# Patient Record
Sex: Male | Born: 2000 | Race: White | Hispanic: No | Marital: Single | Smoking: Current every day smoker
Health system: Southern US, Community
[De-identification: ages and names within clinical notes are randomized; demographics above are authoritative.]

---
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
## 2022-09-12 ENCOUNTER — Other Ambulatory Visit: Payer: Self-pay

## 2022-09-12 ENCOUNTER — Encounter (HOSPITAL_COMMUNITY): Payer: Self-pay

## 2022-09-12 ENCOUNTER — Emergency Department (HOSPITAL_COMMUNITY)
Admission: EM | Admit: 2022-09-12 | Discharge: 2022-09-13 | Disposition: A | Discharge status: Other Acute Inpatient Hospital | Payer: 59 | Source: Home / Self Care | Attending: Emergency Medicine | Admitting: Emergency Medicine

## 2022-09-12 DIAGNOSIS — Z046 Encounter for general psychiatric examination, requested by authority: Secondary | ICD-10-CM | POA: Insufficient documentation

## 2022-09-12 DIAGNOSIS — R45851 Suicidal ideations: Secondary | ICD-10-CM | POA: Insufficient documentation

## 2022-09-12 DIAGNOSIS — Z20822 Contact with and (suspected) exposure to covid-19: Secondary | ICD-10-CM | POA: Insufficient documentation

## 2022-09-12 DIAGNOSIS — F322 Major depressive disorder, single episode, severe without psychotic features: Secondary | ICD-10-CM | POA: Diagnosis not present

## 2022-09-12 DIAGNOSIS — F332 Major depressive disorder, recurrent severe without psychotic features: Secondary | ICD-10-CM | POA: Insufficient documentation

## 2022-09-12 MED ORDER — BUPROPION HCL ER (XL) 150 MG PO TB24
300.0000 mg | ORAL_TABLET | Freq: Every day | ORAL | Status: DC
  Administered 2022-09-13: 300 mg via ORAL
  Filled 2022-09-12: qty 2

## 2022-09-12 MED ORDER — SERTRALINE HCL 50 MG PO TABS
200.0000 mg | ORAL_TABLET | Freq: Every day | ORAL | Status: DC
  Administered 2022-09-13: 200 mg via ORAL
  Filled 2022-09-12: qty 4

## 2022-09-12 MED ORDER — BUSPIRONE HCL 10 MG PO TABS
30.0000 mg | ORAL_TABLET | Freq: Two times a day (BID) | ORAL | Status: DC
  Administered 2022-09-13 (×2): 30 mg via ORAL
  Filled 2022-09-12 (×3): qty 3

## 2022-09-12 NOTE — ED Provider Notes (Signed)
[facility] DEPT Provider Note   CSN: [id number] Arrival date & time: 09/12/22  2223     History  Chief Complaint  Patient presents with   IVC   Suicidal    Nim Gelwicks is a 21 y.o. male.  21 year old male brought in by police under IVC order by [facility].  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  Reports history of anxiety and depression.  Plan was to overdose on car monoxide or nitrous oxide if he could get a hold of these items.  Patient reports a prior suicide attempt by cutting his arm, history of suicidal ideation.  Reports feeling suicidal at this time without plan.  No other complaints or concerns tonight.       Home Medications Prior to Admission medications   Not on File      Allergies    Patient has no known allergies.    Review of Systems   Review of Systems Negative except as per HPI Physical Exam Updated Vital Signs BP (!) 131/90 (BP Location: Left Arm)   Pulse 100   Temp 98.4 F (36.9 C) (Oral)   Resp 18   Ht 6\' 2"  (1.88 m)   Wt 81.6 kg   SpO2 100%   BMI 23.11 kg/m  Physical Exam Vitals and nursing note reviewed.  Constitutional:      General: He is not in acute distress.    Appearance: He is well-developed. He is not diaphoretic.  HENT:     Head: Normocephalic and atraumatic.  Cardiovascular:     Rate and Rhythm: Normal rate and regular rhythm.     Pulses: Normal pulses.     Heart sounds: Normal heart sounds.  Pulmonary:     Effort: Pulmonary effort is normal.     Breath sounds: Normal breath sounds.  Skin:    General: Skin is warm and dry.     Findings: No erythema or rash.  Neurological:     Mental Status: He is alert and oriented to person, place, and time.  Psychiatric:        Attention and Perception: Attention normal.        Mood and Affect: Mood and affect normal.        Speech: Speech normal.        Behavior: Behavior  normal.        Thought Content: Thought content is not paranoid. Thought content includes suicidal ideation. Thought content does not include homicidal ideation. Thought content does not include homicidal or suicidal plan.     ED Results / Procedures / Treatments   Labs (all labs ordered are listed, but only abnormal results are displayed) Labs Reviewed  SARS CORONAVIRUS 2 BY RT PCR  ACETAMINOPHEN LEVEL  COMPREHENSIVE METABOLIC PANEL  ETHANOL  CBC WITH DIFFERENTIAL/PLATELET  SALICYLATE LEVEL  RAPID URINE DRUG SCREEN, HOSP PERFORMED    EKG None  Radiology No results found.  Procedures Procedures    Medications Ordered in ED Medications - No data to display  ED Course/ Medical Decision Making/ A&P                           Medical Decision Making  21 year old male brought in by [facility] under IVC from [place] as above.  No other complaints at this time.  Suicidal ideation without plan currently.  No other complaints.  Pending lab work,  will be medically cleared for behavioral health evaluation and disposition. First exam complete.  Home meds ordered.        Final Clinical Impression(s) / ED Diagnoses Final diagnoses:  Suicidal ideation    Rx / DC Orders ED Discharge Orders     None         Tacy Learn, PA-C 09/12/22 2342    Fredia Sorrow, MD 09/12/22 2352

## 2022-09-12 NOTE — ED Triage Notes (Addendum)
Pt BIB [facility] for IVC from [facility] [facility]. Per [facility] and paperwork--pt is an alcohol and amphetamine user. Pt c/o anxiety and depression. Pt reported tonight that he is having SI, plan to OD on carbon monoxide or nitrous oxide if he can get a hold of these items. Pt is a danger to himself. Pt appears calm and quiet at this time time in triage.

## 2022-09-12 NOTE — ED Notes (Signed)
Pt have been made aware of urine sample. Will info staff when able to give 

## 2022-09-13 ENCOUNTER — Encounter (HOSPITAL_COMMUNITY): Payer: Self-pay | Admitting: Psychiatry

## 2022-09-13 ENCOUNTER — Other Ambulatory Visit: Payer: Self-pay

## 2022-09-13 ENCOUNTER — Other Ambulatory Visit: Payer: Self-pay | Admitting: Psychiatry

## 2022-09-13 ENCOUNTER — Inpatient Hospital Stay (HOSPITAL_COMMUNITY)
Admission: AD | Admit: 2022-09-13 | Discharge: 2022-09-19 | DRG: 885 | Disposition: A | Discharge status: Home / Self Care | Payer: 59 | Source: Intra-hospital | Attending: Psychiatry | Admitting: Psychiatry

## 2022-09-13 DIAGNOSIS — F1010 Alcohol abuse, uncomplicated: Secondary | ICD-10-CM | POA: Diagnosis present

## 2022-09-13 DIAGNOSIS — F17210 Nicotine dependence, cigarettes, uncomplicated: Secondary | ICD-10-CM | POA: Diagnosis present

## 2022-09-13 DIAGNOSIS — F322 Major depressive disorder, single episode, severe without psychotic features: Secondary | ICD-10-CM | POA: Diagnosis present

## 2022-09-13 DIAGNOSIS — Z79899 Other long term (current) drug therapy: Secondary | ICD-10-CM

## 2022-09-13 DIAGNOSIS — R45851 Suicidal ideations: Secondary | ICD-10-CM | POA: Diagnosis present

## 2022-09-13 DIAGNOSIS — F1510 Other stimulant abuse, uncomplicated: Secondary | ICD-10-CM | POA: Diagnosis present

## 2022-09-13 DIAGNOSIS — F1090 Alcohol use, unspecified, uncomplicated: Secondary | ICD-10-CM | POA: Insufficient documentation

## 2022-09-13 DIAGNOSIS — F411 Generalized anxiety disorder: Secondary | ICD-10-CM | POA: Diagnosis present

## 2022-09-13 DIAGNOSIS — Z87828 Personal history of other (healed) physical injury and trauma: Secondary | ICD-10-CM | POA: Diagnosis not present

## 2022-09-13 DIAGNOSIS — Z20822 Contact with and (suspected) exposure to covid-19: Secondary | ICD-10-CM | POA: Diagnosis present

## 2022-09-13 DIAGNOSIS — F4010 Social phobia, unspecified: Secondary | ICD-10-CM | POA: Diagnosis present

## 2022-09-13 DIAGNOSIS — Z9151 Personal history of suicidal behavior: Secondary | ICD-10-CM | POA: Diagnosis not present

## 2022-09-13 DIAGNOSIS — F909 Attention-deficit hyperactivity disorder, unspecified type: Secondary | ICD-10-CM | POA: Diagnosis present

## 2022-09-13 DIAGNOSIS — F1220 Cannabis dependence, uncomplicated: Secondary | ICD-10-CM | POA: Diagnosis present

## 2022-09-13 DIAGNOSIS — G4700 Insomnia, unspecified: Secondary | ICD-10-CM | POA: Diagnosis present

## 2022-09-13 DIAGNOSIS — Z818 Family history of other mental and behavioral disorders: Secondary | ICD-10-CM | POA: Diagnosis not present

## 2022-09-13 DIAGNOSIS — Z751 Person awaiting admission to adequate facility elsewhere: Secondary | ICD-10-CM

## 2022-09-13 DIAGNOSIS — F1590 Other stimulant use, unspecified, uncomplicated: Secondary | ICD-10-CM | POA: Insufficient documentation

## 2022-09-13 LAB — ACETAMINOPHEN LEVEL: Acetaminophen (Tylenol), Serum: <10 ug/mL — ABNORMAL LOW (ref 10–30)

## 2022-09-13 LAB — COMPREHENSIVE METABOLIC PANEL
ALT: 71 U/L — ABNORMAL HIGH (ref 0–44)
AST: 155 U/L — ABNORMAL HIGH (ref 15–41)
Albumin: 4.8 g/dL (ref 3.5–5.0)
Alkaline Phosphatase: 62 U/L (ref 38–126)
Anion gap: 9 (ref 5–15)
BUN: 13 mg/dL (ref 6–20)
CO2: 23 mmol/L (ref 22–32)
Calcium: 9.3 mg/dL (ref 8.9–10.3)
Chloride: 107 mmol/L (ref 98–111)
Creatinine, Ser: 0.86 mg/dL (ref 0.61–1.24)
GFR, Estimated: >60 mL/min (ref >60)
Glucose, Bld: 94 mg/dL (ref 70–99)
Potassium: 3.9 mmol/L (ref 3.5–5.1)
Sodium: 139 mmol/L (ref 135–145)
Total Bilirubin: 0.5 mg/dL (ref 0.3–1.2)
Total Protein: 7.8 g/dL (ref 6.5–8.1)

## 2022-09-13 LAB — CBC WITH DIFFERENTIAL/PLATELET
Abs Immature Granulocytes: 0.04 10*3/uL (ref 0.00–0.07)
Basophils Absolute: 0 10*3/uL (ref 0.0–0.1)
Basophils Relative: 0 %
Eosinophils Absolute: 0.2 10*3/uL (ref 0.0–0.5)
Eosinophils Relative: 2 %
HCT: 45.4 % (ref 39.0–52.0)
Hemoglobin: 16 g/dL (ref 13.0–17.0)
Immature Granulocytes: 0 %
Lymphocytes Relative: 40 %
Lymphs Abs: 4.2 10*3/uL — ABNORMAL HIGH (ref 0.7–4.0)
MCH: 30.9 pg (ref 26.0–34.0)
MCHC: 35.2 g/dL (ref 30.0–36.0)
MCV: 87.8 fL (ref 80.0–100.0)
Monocytes Absolute: 1 10*3/uL (ref 0.1–1.0)
Monocytes Relative: 10 %
Neutro Abs: 5 10*3/uL (ref 1.7–7.7)
Neutrophils Relative %: 48 %
Platelets: 298 10*3/uL (ref 150–400)
RBC: 5.17 MIL/uL (ref 4.22–5.81)
RDW: 12.3 % (ref 11.5–15.5)
WBC: 10.3 10*3/uL (ref 4.0–10.5)
nRBC: 0 % (ref 0.0–0.2)

## 2022-09-13 LAB — RAPID URINE DRUG SCREEN, HOSP PERFORMED
Amphetamines: NOT DETECTED
Barbiturates: NOT DETECTED
Benzodiazepines: NOT DETECTED
Cocaine: NOT DETECTED
Opiates: NOT DETECTED
Tetrahydrocannabinol: POSITIVE — AB

## 2022-09-13 LAB — SARS CORONAVIRUS 2 BY RT PCR: SARS Coronavirus 2 by RT PCR: NEGATIVE

## 2022-09-13 LAB — SALICYLATE LEVEL: Salicylate Lvl: <7 mg/dL — ABNORMAL LOW (ref 7.0–30.0)

## 2022-09-13 LAB — ETHANOL: Alcohol, Ethyl (B): <10 mg/dL (ref <10)

## 2022-09-13 MED ORDER — NICOTINE POLACRILEX 2 MG MT GUM
CHEWING_GUM | OROMUCOSAL | Status: AC
  Filled 2022-09-13: qty 1

## 2022-09-13 MED ORDER — QUETIAPINE FUMARATE ER 300 MG PO TB24
300.0000 mg | ORAL_TABLET | Freq: Once | ORAL | Status: AC
  Administered 2022-09-13: 300 mg via ORAL
  Filled 2022-09-13 (×2): qty 1

## 2022-09-13 MED ORDER — ACETAMINOPHEN 325 MG PO TABS
650.0000 mg | ORAL_TABLET | Freq: Four times a day (QID) | ORAL | Status: DC | PRN
  Administered 2022-09-15: 650 mg via ORAL
  Filled 2022-09-13: qty 2

## 2022-09-13 MED ORDER — QUETIAPINE FUMARATE 300 MG PO TABS
300.0000 mg | ORAL_TABLET | Freq: Every day | ORAL | Status: DC
  Administered 2022-09-13: 300 mg via ORAL
  Filled 2022-09-13 (×4): qty 1

## 2022-09-13 MED ORDER — DIPHENHYDRAMINE HCL 25 MG PO CAPS
25.0000 mg | ORAL_CAPSULE | Freq: Once | ORAL | Status: AC
  Administered 2022-09-13: 25 mg via ORAL
  Filled 2022-09-13: qty 1

## 2022-09-13 MED ORDER — ALUM & MAG HYDROXIDE-SIMETH 200-200-20 MG/5ML PO SUSP: 30.0000 mL | ORAL | Status: DC | PRN

## 2022-09-13 MED ORDER — NICOTINE POLACRILEX 2 MG MT GUM
2.0000 mg | CHEWING_GUM | OROMUCOSAL | Status: DC | PRN
  Administered 2022-09-13: 2 mg via ORAL

## 2022-09-13 MED ORDER — LORAZEPAM 1 MG PO TABS
1.0000 mg | ORAL_TABLET | Freq: Once | ORAL | Status: AC
  Administered 2022-09-13: 1 mg via ORAL
  Filled 2022-09-13: qty 1

## 2022-09-13 MED ORDER — MAGNESIUM HYDROXIDE 400 MG/5ML PO SUSP: 30.0000 mL | Freq: Every day | ORAL | Status: DC | PRN

## 2022-09-13 NOTE — ED Notes (Signed)
Observation over night recommended and reassess in the am
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
## 2022-09-13 NOTE — ED Notes (Signed)
Report called to nurse [facility] and transport call.

## 2022-09-13 NOTE — ED Provider Notes (Signed)
*  Anemia Physical Exam  BP (!) 131/90 (BP Location: Left Arm)   Pulse 100   Temp 98.4 F (36.9 C) (Oral)   Resp 18   Ht 6\' 2"  (1.88 m)   Wt 81.6 kg   SpO2 100%   BMI 23.11 kg/m   Physical Exam  Procedures  Procedures  ED Course / MDM   Clinical Course as of 09/13/22 0522  Tue Sep 13, 2022  0522 Patient becoming increasingly anxious, requesting medication to help with this.  Willing to take oral medication.  Cooperative with ED RN.  Medications ordered. [RS]    Clinical Course User Index [RS] Oanh Devivo, Gypsy Balsam, PA-C   Medical Decision Making Amount and/or Complexity of Data Reviewed Labs: ordered.    Details: CBC without leukocytosis, CMP with transaminitis with AST/ALT 155/71, otherwise unremarkable.  Acetaminophen, ethanol, and salicylate levels are normal.  COVID test was negative.  Risk Prescription drug management.    Care of the patient is in from preceding ED provider Marga Hoots, PA-C at time of shift change.  Please see her associated note for further insight and the patient's ED course.  In brief patient presenting under IVC from [place] for suicidality with plan.  At time of shift change patient is resting comfortably in hall bed C, mildly hypertensive but vital signs are otherwise normal.  He is awaiting completion of labs for medical clearance.  PATIENT MEDICALLY CLEARED FOR TTS AT THIS TIME. 0131 09/13/22. Resting comfortably in his hallway bed.  Disposition pending psych evaluation and recommendation.  Per review of psych eval, patient to be observed in the ER and reevaluated on psych rounds later this morning. He remains resting in bed calmly at this time. Requesting dose of nightly seroquel xl 300 mg. Ordered. No further workup warranted in the ED at this time.  Ultimate disposition pending psych reeval later today.  Francisco Yates  voiced understanding of her medical evaluation and treatment plan. Each of their questions answered to their expressed  satisfaction.   This chart was dictated using voice recognition software, Dragon. Despite the best efforts of this provider to proofread and correct errors, errors may still occur which can change documentation meaning.      Francisco Darling, PA-C 09/13/22 0336    Quintella Reichert, MD 09/13/22 [phone]

## 2022-09-13 NOTE — Tx Team (Signed)
Initial Treatment Plan 09/13/2022 3:55 PM [name] [id number]    PATIENT STRESSORS: Financial difficulties   Substance abuse     PATIENT STRENGTHS: Motivation for treatment/growth  Physical Health  Supportive family/friends    PATIENT IDENTIFIED PROBLEMS: "To be happier"  "Not to have SI thoughts"  Depression  Anxiety  SI thoughts             DISCHARGE CRITERIA:  Ability to meet basic life and health needs Adequate post-discharge living arrangements Motivation to continue treatment in a less acute level of care  PRELIMINARY DISCHARGE PLAN: Attend aftercare/continuing care group Outpatient therapy Return to previous living arrangement  PATIENT/FAMILY INVOLVEMENT: This treatment plan has been presented to and reviewed with the patient, Francisco Yates.  The patient and family have been given the opportunity to ask questions and make suggestions.  Coralyn Mark Kiala Faraj, RN 09/13/2022, 3:55 PM

## 2022-09-13 NOTE — [facility] Group Notes (Signed)
Adult Psychoeducational Group Note  Date:  09/13/2022 Time:  8:36 PM  Group Topic/Focus:  Wrap-Up Group:   The focus of this group is to help patients review their daily goal of treatment and discuss progress on daily workbooks.  Participation Level:  Active  Participation Quality:  Appropriate and Attentive  Affect:  Appropriate  Cognitive:  Alert and Appropriate  Insight: Appropriate and Good  Engagement in Group:  Engaged  Modes of Intervention:  Discussion and Education  Additional Comments:  Pt attended and participated in wrap up group this evening and rated their day a 5/10. Pt states that since being here they feel anxious, and feels anger towards themself for actions that got them here. Pt states that they are open to residential treatment or an IOP once they are D/C. Pt states that prior to coming to [facility] they were in a rehab center. Pt feels that they received help for substance abuse, but did not feel that their mental health was addressed.   Cristi Loron 09/13/2022, 8:36 PM

## 2022-09-13 NOTE — Progress Notes (Signed)
Pt stated he was feeling a little better this evening    09/13/22 2330  Psych Admission Type (Psych Patients Only)  Admission Status Involuntary  Psychosocial Assessment  Patient Complaints Self-harm thoughts  Eye Contact Fair  Facial Expression Flat  Affect Anxious  Speech Logical/coherent  Interaction Assertive  Motor Activity Slow  Appearance/Hygiene Unremarkable  Behavior Characteristics Cooperative  Mood Anxious  Aggressive Behavior  Effect No apparent injury  Thought Process  Coherency WDL  Content WDL  Delusions WDL  Perception WDL  Hallucination None reported or observed  Judgment WDL  Confusion None  Danger to Self  Current suicidal ideation? Passive  Self-Injurious Behavior Some self-injurious ideation observed or expressed.  No lethal plan expressed   Agreement Not to Harm Self Yes  Description of Agreement Verbal contract for safety

## 2022-09-13 NOTE — Progress Notes (Signed)
Pt was accepted to [facility] 09/13/22; Bed Assignment 304-1 PENDING voluntary consent for admission faxed to [phone]  Dx MDD.   Pt meets inpatient criteria per Oneida Alar, NP  Attending Physician will be Dr. Janine Limbo  Report can be called to: Adult unit: [phone]  Pt can arrive after 12pm  Care Team notified: [facility] Lynnda Shields, RN, Corinna Gab, RN, Lissa Merlin, Laurena Spies, RN, Oneida Alar, NP  Nadara Mode, LCSWA 09/13/2022 @ 12:24 PM

## 2022-09-13 NOTE — Progress Notes (Signed)
Admission Note:Patient is a 21 year old male admitted to the unit under IVC from [facility] for feeling suicidal which he currently denies and verbally contracts for safety.  Patient is alert and oriented x 4.  Presents with anxious mood and affect.  Stated goal is to be happier and to stop having SI thoughts.  Reports stressors as being lonely and unemployed.  Admission plan of care reviewed, consent signed.  Skin and personal belongings completed.  Skin is dry and intact.  No contraband found.  Patient oriented to the unit, staff and room.  Routine safety checks initiated.  Verbalizes understanding of unit rules/protocols.  Patient is safe on the unit.

## 2022-09-14 ENCOUNTER — Encounter (HOSPITAL_COMMUNITY): Payer: Self-pay | Admitting: Psychiatry

## 2022-09-14 ENCOUNTER — Encounter (HOSPITAL_COMMUNITY): Payer: Self-pay

## 2022-09-14 DIAGNOSIS — F909 Attention-deficit hyperactivity disorder, unspecified type: Secondary | ICD-10-CM | POA: Insufficient documentation

## 2022-09-14 DIAGNOSIS — F1590 Other stimulant use, unspecified, uncomplicated: Secondary | ICD-10-CM | POA: Insufficient documentation

## 2022-09-14 DIAGNOSIS — G4700 Insomnia, unspecified: Secondary | ICD-10-CM | POA: Insufficient documentation

## 2022-09-14 DIAGNOSIS — F4010 Social phobia, unspecified: Secondary | ICD-10-CM | POA: Insufficient documentation

## 2022-09-14 DIAGNOSIS — F1090 Alcohol use, unspecified, uncomplicated: Secondary | ICD-10-CM | POA: Insufficient documentation

## 2022-09-14 DIAGNOSIS — F1220 Cannabis dependence, uncomplicated: Secondary | ICD-10-CM | POA: Insufficient documentation

## 2022-09-14 DIAGNOSIS — F411 Generalized anxiety disorder: Secondary | ICD-10-CM | POA: Insufficient documentation

## 2022-09-14 MED ORDER — TRAZODONE HCL 50 MG PO TABS
50.0000 mg | ORAL_TABLET | Freq: Every evening | ORAL | Status: DC | PRN
  Administered 2022-09-15 – 2022-09-18 (×3): 50 mg via ORAL
  Filled 2022-09-14 (×3): qty 1

## 2022-09-14 MED ORDER — VENLAFAXINE HCL ER 37.5 MG PO CP24
37.5000 mg | ORAL_CAPSULE | Freq: Every day | ORAL | Status: AC
  Administered 2022-09-15: 37.5 mg via ORAL
  Filled 2022-09-14: qty 1

## 2022-09-14 MED ORDER — BUSPIRONE HCL 10 MG PO TABS
20.0000 mg | ORAL_TABLET | Freq: Three times a day (TID) | ORAL | Status: DC
  Administered 2022-09-14 – 2022-09-19 (×15): 20 mg via ORAL
  Filled 2022-09-14 (×18): qty 2

## 2022-09-14 MED ORDER — SERTRALINE HCL 50 MG PO TABS
150.0000 mg | ORAL_TABLET | Freq: Every day | ORAL | Status: AC
  Administered 2022-09-14: 150 mg via ORAL
  Filled 2022-09-14 (×2): qty 3

## 2022-09-14 MED ORDER — ACAMPROSATE CALCIUM 333 MG PO TBEC
666.0000 mg | DELAYED_RELEASE_TABLET | Freq: Three times a day (TID) | ORAL | Status: DC
  Administered 2022-09-14 – 2022-09-19 (×15): 666 mg via ORAL
  Filled 2022-09-14 (×17): qty 2

## 2022-09-14 MED ORDER — SERTRALINE HCL 50 MG PO TABS
50.0000 mg | ORAL_TABLET | Freq: Every day | ORAL | Status: AC
  Administered 2022-09-16: 50 mg via ORAL
  Filled 2022-09-14: qty 1

## 2022-09-14 MED ORDER — QUETIAPINE FUMARATE 100 MG PO TABS
100.0000 mg | ORAL_TABLET | Freq: Every day | ORAL | Status: DC
  Administered 2022-09-14: 100 mg via ORAL
  Filled 2022-09-14 (×3): qty 1

## 2022-09-14 MED ORDER — HYDROXYZINE HCL 50 MG PO TABS
50.0000 mg | ORAL_TABLET | Freq: Once | ORAL | Status: AC
  Administered 2022-09-14: 50 mg via ORAL
  Filled 2022-09-14 (×2): qty 1

## 2022-09-14 MED ORDER — MIRTAZAPINE 15 MG PO TBDP
15.0000 mg | ORAL_TABLET | Freq: Every day | ORAL | Status: DC
  Administered 2022-09-14 – 2022-09-15 (×2): 15 mg via ORAL
  Filled 2022-09-14 (×5): qty 1

## 2022-09-14 MED ORDER — VENLAFAXINE HCL ER 75 MG PO CP24
75.0000 mg | ORAL_CAPSULE | Freq: Every day | ORAL | Status: DC
  Administered 2022-09-16: 75 mg via ORAL
  Filled 2022-09-14 (×3): qty 1

## 2022-09-14 MED ORDER — SERTRALINE HCL 100 MG PO TABS
100.0000 mg | ORAL_TABLET | Freq: Every day | ORAL | Status: AC
  Administered 2022-09-15: 100 mg via ORAL
  Filled 2022-09-14: qty 1

## 2022-09-14 MED ORDER — BUPROPION HCL ER (XL) 300 MG PO TB24
300.0000 mg | ORAL_TABLET | Freq: Every day | ORAL | Status: DC
  Administered 2022-09-15 – 2022-09-19 (×5): 300 mg via ORAL
  Filled 2022-09-14 (×6): qty 1

## 2022-09-14 MED ORDER — HYDROXYZINE HCL 25 MG PO TABS
25.0000 mg | ORAL_TABLET | Freq: Three times a day (TID) | ORAL | Status: DC | PRN
  Administered 2022-09-15 – 2022-09-18 (×3): 25 mg via ORAL
  Filled 2022-09-14 (×3): qty 1

## 2022-09-14 MED ORDER — PROPRANOLOL HCL 20 MG PO TABS
20.0000 mg | ORAL_TABLET | Freq: Three times a day (TID) | ORAL | Status: DC
  Administered 2022-09-14 – 2022-09-16 (×6): 20 mg via ORAL
  Filled 2022-09-14 (×12): qty 1

## 2022-09-14 MED ORDER — NICOTINE 21 MG/24HR TD PT24
21.0000 mg | MEDICATED_PATCH | Freq: Every day | TRANSDERMAL | Status: DC
  Administered 2022-09-14 – 2022-09-15 (×2): 21 mg via TRANSDERMAL
  Filled 2022-09-14 (×4): qty 1

## 2022-09-14 NOTE — Group Note (Signed)
LCSW Group Therapy Note  Group Date: 09/14/2022 Start Time: 1300 End Time: 1400   Type of Therapy and Topic:  Group Therapy: Positive Affirmations  Participation Level:  Did Not Attend   Description of Group:   This group addressed positive affirmation towards self and others.  Patients went around the room and identified two positive things about themselves and two positive things about a peer in the room.  Patients reflected on how it felt to share something positive with others, to identify positive things about themselves, and to hear positive things from others/ Patients were encouraged to have a daily reflection of positive characteristics or circumstances.   Therapeutic Goals: Patients will verbalize two of their positive qualities Patients will demonstrate empathy for others by stating two positive qualities about a peer in the group Patients will verbalize their feelings when voicing positive self affirmations and when voicing positive affirmations of others Patients will discuss the potential positive impact on their wellness/recovery of focusing on positive traits of self and others    Keelen Quevedo S Cas Tracz, LCSW 09/14/2022  3:33 PM    

## 2022-09-14 NOTE — Plan of Care (Signed)
  Problem: Education: Goal: Emotional status will improve Outcome: Progressing   Problem: Education: Goal: Mental status will improve Outcome: Progressing   Problem: Activity: Goal: Sleeping patterns will improve Outcome: Progressing   Problem: Health Behavior/Discharge Planning: Goal: Compliance with treatment plan for underlying cause of condition will improve Outcome: Progressing   Problem: Education: Goal: Utilization of techniques to improve thought processes will improve Outcome: Progressing

## 2022-09-14 NOTE — Progress Notes (Signed)
   09/14/22 2045  Psych Admission Type (Psych Patients Only)  Admission Status Involuntary  Psychosocial Assessment  Patient Complaints Anxiety  Eye Contact Fair  Facial Expression Flat  Affect Anxious  Speech Logical/coherent  Interaction Assertive  Motor Activity Slow  Appearance/Hygiene Unremarkable  Behavior Characteristics Cooperative  Mood Anxious  Aggressive Behavior  Effect No apparent injury  Thought Process  Coherency WDL  Content WDL  Delusions WDL  Perception WDL  Hallucination None reported or observed  Judgment WDL  Confusion None  Danger to Self  Current suicidal ideation? Passive  Self-Injurious Behavior Some self-injurious ideation observed or expressed.  No lethal plan expressed   Agreement Not to Harm Self Yes  Description of Agreement Verbal contract for safety

## 2022-09-14 NOTE — [facility] Notes (Signed)
Patient did not attend NA meeting.  

## 2022-09-14 NOTE — H&P (Signed)
Psychiatric Admission Assessment Adult  Patient Identification: [place] MRN:  [id number] Date of Evaluation:  09/14/2022 Chief Complaint:  MDD (major depressive disorder), severe (HCC) [F32.2] Principal Diagnosis: MDD (major depressive disorder), severe (HCC) Diagnosis:  Principal Problem:   MDD (major depressive disorder), severe (HCC) Active Problems:   Alcohol use disorder   Stimulant use disorder   Insomnia   Delta-9-tetrahydrocannabinol (THC) dependence (HCC)   Attention deficit hyperactivity disorder (ADHD)   GAD (generalized anxiety disorder)   Social anxiety disorder  History of Present Illness: [place] is a 21 yo Caucasian male with prior mental health diagnoses of ADHD, MDD, GAD, polysubstance use  (ETOH, THC, amphetamines)  & social anxiety d/o who was IVC'd at [facility] for suicidal ideations with plan to overdose on Co2 or nitrous oxide. Pt was transported to the [facility] by [facility], and was subsequently transferred to this [facility] for treatment and stabilization of his mood.   On assessment patient reports that he had written down that he was feeling depressed, and was feeling suicidal and the staff at [facility] approached him for more assessment.  He reports that he was asked what he would do if he ever wanted to kill himself, and he told them he will use carbon monoxide poisoning.  He reports that he was feeling suicidal but denies that he had a plan to use carbon monoxide poisoning, and also denies that he had any intent to self harm.  Patient however, reports "feeling like a burden on the ward.  Society expects me to do more, but I feel like I have nothing to offer, and I have not started giving back."  Patient reports a poor self esteem, reports feeling like a failure, reports poor sleep for the past couple of years, worsening over the past months.  Patient reports decreased energy levels, poor concentration levels,  irritability, poor appetite, anger, feelings of frustration, and hopelessness.  He reports feeling like he will never be free of depression.  Patient reports his current stressors as having $1500 in credit card debt, and not going to school and also not working.  Patient also reports feeling like [facility] cannot help him.  He reports feeling like his continued stay at the program would have been self deceptive because "I am not a religious person, and it is a 12-step program."  Patient reports that he was in [place] only for residential treatment for substance abuse, but lives in [place], [place] with his parents.  Past Psychiatric Hx: Patien patient able to collaborate his mental health diagnoses as listed above (GAD, MDD, social anxiety, ADHD).  Patient reports that his mental health issues started at age 54 when he was in middle school in eighth or ninth grade.  He reports a history of being bullied at that time and reports that he has had low self esteem since that time.  He reports that between ages 73 and 48 he overdosed on Xanax and alcohol.  He states that it was unintentional at the time, but that he was hospitalized at [facility].  He reports [facility] as being his only inpatient related mental health hospitalization prior to coming to this [facility].   Patient reports a history of self-injurious behaviors, but states that he self injured when he was in high school and that behavior stopped earlier this year.  He reports that he was self injuring to relieve emotional stress.  Pt reports a  history of head trauma at age 57 after a fall, states that he sustained a concussion at the time. He denies any history of seizures.  Patient denies any manic type symptoms in the past, he denies any past or current history of auditory or visual hallucinations, denies any past history of paranoia, and denies any history of delusional thinking or any other psychosis.   Pt denies  any history of physical, emotional or sexual abuse.   Substance use history:  Patient reports a history of alcohol abuse which started at age 58 years old.  He reports that drinking alcohol started becoming a habit and an everyday thing starting at age 30 years old.  He reports that he drank when he turned 19 every day for 9 months and now only drinks alcohol once weekly.  He reports that he typically shares a 6 pack or a 12 pack of beer with a friend.  Patient is inconsistent regarding his reports of alcohol use, as he tells attending psychiatrist that he drinks 3 times per week.  He reports a history of multiple blackouts in the past and reports that he has a DUI which he sustained in May 2022.  Patient also reports a history of THC use which was daily up until 2 weeks prior to going to [facility].  He reports that he typically uses delta 8 and this had been started at age 89 years old.  He reports that 2 weeks prior to going to [facility], he was only using marijuana 2 times weekly.  Patient also reports that he has tried almost every substance that there is, with the exception of only a few.  He reports that he has a history of cocaine use but has not used it in at least a year, he reports a history of Adderall and Vyvanse abuse states he was buying it from the streets when he was in high school but that it was eventually prescribed for him for 3 months.  He reports that the prescriber stopped prescribing it, when they found out that he was abusing it.  He reports that he has not used Vyvanse or Adderall in at least a year.  Patient also reports a history of fentanyl use but states that it has been a year since he last used it.  He reports trying mushrooms in 2021 states that he used it twice, also states that he used acid twice in 2021.  Patient reports nicotine use and states that he develops it every day.  He asked for a patch to be ordered for him while at this [facility].  Patient denies  ever using heroine and denies ever using meth.  Past psychiatric medication history: Patient reports that he has tried a lot of psychotropic medications, but feels like none has helped him.  He reports a history of trying Lexapro from 2017 to March 2023.  He reports trying Prozac in May 2023 for 1 month.  He reports trying Abilify for 2 weeks but states that it caused restlessness and was stopped.  He reports that his current medications are sertraline 200 mg daily which he started in July 2023,  BuSpar which she started in June 2023, Seroquel was she started in August 2023, Wellbutrin which she started in 2017, and propranolol (unsure of when he started this).  Patient denies any other psychotropic medication trials in the past.  That he has a mental health provider and a therapist in [place].  Patient reports that he would  like to keep his current outpatient providers.  Family history:  Patient reports that his maternal aunt committed suicide by jumping out of a window in a high-rise building.  He reports that she had a history of bipolar disorder and substance abuse, and passed away in Mar 31, 2006.  Patient reports that his paternal grandfather had a history of ADHD, he reports that his father has a history of ADHD and MDD.  He reports that his maternal aunt has a history of bipolar disorder, reports that his younger brother has a history of autism and another brother has ADHD.  Patient also reports that his mother first from MDD, and reports that his maternal grandfather suffered from alcoholism and died of liver failure.  Past Medical History: Patient reports a medical history of Pectus excavatum, but states that it was surgically repaired.  He denies any other medical conditions.  Prior Surgeries: Wisdom tooth extraction and surgery as noted above. Head trauma, LOC, concussions, seizures: no seizures, h/o 1 concussion from falling at age 2.  Allergies: Denies Contraception: none PCP: Unable to recall  name MH Provider: Has one in [place], unable to recall name, would like to keep outpatient mental health provider and therapist  Additional Social History: Pt reports that he lives with his parents and siblings and [facility] [place].  Patient reports that his parents are supportive, he reports highest level of education has been some college.  He reports that he was at school at [employer] but dropped out in March of this year.  He reports his hobbies as exercise and playing video games.  He reports that he is currently dependent on his parents for support and does not currently work.  Patient reports his sexual orientation as heterosexual and states that he is currently single.  Current Presentation: During this encounter, pt presents with a depressed & anxious mood, & affect is congruent. He is oriented to person, place, time & situation, and knows the current president.  His attention to personal hygiene and grooming is fair, eye contact is minimal, speech is clear & coherent. Thought contents are organized and logical, and pt currently denies SI/HI/AVH. He denies feelings of paranoia and there is no evidence of delusional thinking.  Medication plan: Patient is agreeable to medication adjustments for plan of his mood.  Will add Hydroxyzine 25 mg PRN for anxiety and Trazodone 50 mg PRN nightly for insomnia. Plan: Taper sertraline from 200 mg to 150 mg once daily today, then decrease to 100 mg for 1 day, then to 50 mg for one day, then stop. start effexor 37.5 mg once daily tomorrow, then increase to 75 mg once daily. change buspar from 30 mg bid to 20 mg bid. continue wellbutrin xl 300 mg once daily. continue propranolol 20 mg q8H. decrease seroquel to 100 mg qhs. start mirtazapine 15 mg qhs. start acamprosate 666 mg tid for alcohol use disorder (pt reports naltrexone previously made him more depressed)  Associated Signs/Symptoms: Depression Symptoms:  depressed mood, anhedonia, insomnia, feelings of  worthlessness/guilt, difficulty concentrating, hopelessness, suicidal thoughts with specific plan, anxiety, loss of energy/fatigue, disturbed sleep, Duration of Depression Symptoms: Greater than two weeks  (Hypo) Manic Symptoms:   none Anxiety Symptoms:  Excessive Worry, Psychotic Symptoms:   none PTSD Symptoms: Had a traumatic exposure:  History of being bullied Total Time spent with patient: 1.5 hours  Past Psychiatric History: As above  Is the patient at risk to self? Yes.    Has the patient been a risk  to self in the past 6 months? Yes.    Has the patient been a risk to self within the distant past? No.  Is the patient a risk to others? No.  Has the patient been a risk to others in the past 6 months? No.  Has the patient been a risk to others within the distant past? No.   [place] Scale:  [place] Admission (Current) from 09/13/2022 in [facility] ED from 09/12/2022 in [place] DEPT  C-SSRS RISK CATEGORY No Risk High Risk        Prior Inpatient Therapy:   Prior Outpatient Therapy:    Alcohol Screening: 1. How often do you have a drink containing alcohol?: Never 2. How many drinks containing alcohol do you have on a typical day when you are drinking?: 1 or 2 3. How often do you have six or more drinks on one occasion?: Never AUDIT-C Score: 0 Alcohol Brief Interventions/Follow-up: Patient Refused Substance Abuse History in the last 12 months:  Yes.   Consequences of Substance Abuse: Worsening of depressive symptoms Previous Psychotropic Medications: Yes  Psychological Evaluations: No  Past Medical History: History reviewed. No pertinent past medical history. History reviewed. No pertinent surgical history. Family History: History reviewed. No pertinent family history. Family Psychiatric  History: As above Tobacco Screening:   Social History:  Social History   Substance and Sexual Activity   Alcohol Use Yes   Comment: reports previosly he would drink 8-10 standard drinks 3-4x weekly, since previous inpatient rehab sting in June, alcohol use has decreased to 6 stand drinks 1x weekly     Social History   Substance and Sexual Activity  Drug Use Yes   Types: Marijuana   Comment: reports reduciton in [facility] (Delta8) use from 3-4x weekly to 1x weekly since prior rehab stay in Jun of 2023    Additional Social History: Marital status: Single Are you sexually active?: No What is your sexual orientation?: Heterosexual Does patient have children?: No    Allergies:  No Known Allergies Lab Results:  Results for orders placed or performed during the [facility] encounter of 09/12/22 (from the past 48 hour(s))  Acetaminophen level     Status: Abnormal   Collection Time: 09/12/22 11:31 PM  Result Value Ref Range   Acetaminophen (Tylenol), Serum <10 (L) 10 - 30 ug/mL    Comment: (NOTE) Therapeutic concentrations vary significantly. A range of 10-30 ug/mL  may be an effective concentration for many patients. However, some  are best treated at concentrations outside of this range. Acetaminophen concentrations >150 ug/mL at 4 hours after ingestion  and >50 ug/mL at 12 hours after ingestion are often associated with  toxic reactions.  Performed at [facility], [place] [address], [place], [place] [address]   Comprehensive metabolic panel     Status: Abnormal   Collection Time: 09/12/22 11:31 PM  Result Value Ref Range   Sodium 139 135 - 145 mmol/L   Potassium 3.9 3.5 - 5.1 mmol/L   Chloride 107 98 - 111 mmol/L   CO2 23 22 - 32 mmol/L   Glucose, Bld 94 70 - 99 mg/dL    Comment: Glucose reference range applies only to samples taken after fasting for at least 8 hours.   BUN 13 6 - 20 mg/dL   Creatinine, Ser 0.86 0.61 - 1.24 mg/dL   Calcium 9.3 8.9 - 10.3 mg/dL   Total Protein 7.8 6.5 - 8.1 g/dL  Albumin 4.8 3.5 - 5.0 g/dL   AST 161 (H) 15 - 41 U/L   ALT 71 (H) 0 -  44 U/L   Alkaline Phosphatase 62 38 - 126 U/L   Total Bilirubin 0.5 0.3 - 1.2 mg/dL   GFR, Estimated >09 >60 mL/min    Comment: (NOTE) Calculated using the CKD-EPI Creatinine Equation (2021)    Anion gap 9 5 - 15    Comment: Performed at [facility], [address] [place], [place] [address]  Ethanol     Status: None   Collection Time: 09/12/22 11:31 PM  Result Value Ref Range   Alcohol, Ethyl (B) <10 <10 mg/dL    Comment: (NOTE) Lowest detectable limit for serum alcohol is 10 mg/dL.  For medical purposes only. Performed at [facility], [address] [place], [place] [address]   CBC with Differential     Status: Abnormal   Collection Time: 09/12/22 11:31 PM  Result Value Ref Range   WBC 10.3 4.0 - 10.5 K/uL   RBC 5.17 4.22 - 5.81 MIL/uL   Hemoglobin 16.0 13.0 - 17.0 g/dL   HCT 47.8 29.5 - 62.1 %   MCV 87.8 80.0 - 100.0 fL   MCH 30.9 26.0 - 34.0 pg   MCHC 35.2 30.0 - 36.0 g/dL   RDW 30.8 65.7 - 84.6 %   Platelets 298 150 - 400 K/uL   nRBC 0.0 0.0 - 0.2 %   Neutrophils Relative % 48 %   Neutro Abs 5.0 1.7 - 7.7 K/uL   Lymphocytes Relative 40 %   Lymphs Abs 4.2 (H) 0.7 - 4.0 K/uL   Monocytes Relative 10 %   Monocytes Absolute 1.0 0.1 - 1.0 K/uL   Eosinophils Relative 2 %   Eosinophils Absolute 0.2 0.0 - 0.5 K/uL   Basophils Relative 0 %   Basophils Absolute 0.0 0.0 - 0.1 K/uL   Immature Granulocytes 0 %   Abs Immature Granulocytes 0.04 0.00 - 0.07 K/uL    Comment: Performed at [facility], [address] [place], [place] [address]  Salicylate level     Status: Abnormal   Collection Time: 09/12/22 11:31 PM  Result Value Ref Range   Salicylate Lvl <7.0 (L) 7.0 - 30.0 mg/dL    Comment: Performed at [facility], [address], [place], [place] [address]  SARS Coronavirus 2 by RT PCR ([facility] order, performed in [facility] lab) *cepheid single result test* Anterior Nasal Swab      Status: None   Collection Time: 09/12/22 11:40 PM   Specimen: Anterior Nasal Swab  Result Value Ref Range   SARS Coronavirus 2 by RT PCR NEGATIVE NEGATIVE    Comment: (NOTE) SARS-CoV-2 target nucleic acids are NOT DETECTED.  The SARS-CoV-2 RNA is generally detectable in upper and lower respiratory specimens during the acute phase of infection. The lowest concentration of SARS-CoV-2 viral copies this assay can detect is 250 copies / mL. A negative result does not preclude SARS-CoV-2 infection and should not be used as the sole basis for treatment or other patient management decisions.  A negative result may occur with improper specimen collection / handling, submission of specimen other than nasopharyngeal swab, presence of viral mutation(s) within the areas targeted by this assay, and inadequate number of viral copies (<250 copies / mL). A negative result must be combined with clinical observations, patient history, and epidemiological information.  Fact Sheet for Patients:   RoadLapTop.co.za  Fact Sheet for Healthcare Providers: http://kim-miller.com/  This test is not yet approved or  cleared by the [place] FDA and has been authorized for detection and/or diagnosis of SARS-CoV-2 by FDA under an Emergency Use Authorization (EUA).  This EUA will remain in effect (meaning this test can be used) for the duration of the COVID-19 declaration under Section 564(b)(1) of the Act, 21 U.S.C. section 360bbb-3(b)(1), unless the authorization is terminated or revoked sooner.  Performed at [facility], [address] [place], [place] [address]   Urine rapid drug screen (hosp performed)     Status: Abnormal   Collection Time: 09/13/22  5:28 AM  Result Value Ref Range   Opiates NONE DETECTED NONE DETECTED   Cocaine NONE DETECTED NONE DETECTED   Benzodiazepines NONE DETECTED NONE DETECTED   Amphetamines NONE DETECTED  NONE DETECTED   Tetrahydrocannabinol POSITIVE (A) NONE DETECTED   Barbiturates NONE DETECTED NONE DETECTED    Comment: (NOTE) DRUG SCREEN FOR MEDICAL PURPOSES ONLY.  IF CONFIRMATION IS NEEDED FOR ANY PURPOSE, NOTIFY LAB WITHIN 5 DAYS.  LOWEST DETECTABLE LIMITS FOR URINE DRUG SCREEN Drug Class                     Cutoff (ng/mL) Amphetamine and metabolites    1000 Barbiturate and metabolites    200 Benzodiazepine                 200 Opiates and metabolites        300 Cocaine and metabolites        300 THC                            50 Performed at [facility], [address] [place], [place] [address]     Blood Alcohol level:  Lab Results  Component Value Date   ETH <10 09/12/2022    Metabolic Disorder Labs:  No results found for: "HGBA1C", "MPG" No results found for: "PROLACTIN" No results found for: "CHOL", "TRIG", "HDL", "CHOLHDL", "VLDL", "LDLCALC"  Current Medications: Current Facility-Administered Medications  Medication Dose Route Frequency Provider Last Rate Last Admin   acamprosate (CAMPRAL) tablet 666 mg  666 mg Oral TID WC Massengill, [place] Donath, MD   666 mg at 09/14/22 1659   acetaminophen (TYLENOL) tablet 650 mg  650 mg Oral Q6H PRN Leevy-Johnson, Brooke A, NP       alum & mag hydroxide-simeth (MAALOX/MYLANTA) 200-200-20 MG/5ML suspension 30 mL  30 mL Oral Q4H PRN Leevy-Johnson, Brooke A, NP       [START ON 09/15/2022] buPROPion (WELLBUTRIN XL) 24 hr tablet 300 mg  300 mg Oral Daily Massengill, Nathan, MD       busPIRone (BUSPAR) tablet 20 mg  20 mg Oral Q8H Massengill, Nathan, MD   20 mg at 09/14/22 1700   hydrOXYzine (ATARAX) tablet 25 mg  25 mg Oral TID PRN Massengill, [place] Donath, MD       magnesium hydroxide (MILK OF MAGNESIA) suspension 30 mL  30 mL Oral Daily PRN Leevy-Johnson, Brooke A, NP       mirtazapine (REMERON SOL-TAB) disintegrating tablet 15 mg  15 mg Oral QHS Massengill, Nathan, MD       nicotine (NICODERM CQ - dosed in mg/24 hours)  patch 21 mg  21 mg Transdermal Daily Herald Vallin, NP   21 mg at 09/14/22 1112   propranolol (INDERAL) tablet 20 mg  20 mg Oral Q8H Massengill,  [place] Donath, MD   20 mg at 09/14/22 1659   QUEtiapine (SEROQUEL) tablet 100 mg  100 mg Oral QHS Massengill, [place] Donath, MD       Melene Muller ON 09/15/2022] sertraline (ZOLOFT) tablet 100 mg  100 mg Oral Daily Massengill, Nathan, MD       Followed by   Melene Muller ON 09/16/2022] sertraline (ZOLOFT) tablet 50 mg  50 mg Oral Daily Massengill, [place] Donath, MD       traZODone (DESYREL) tablet 50 mg  50 mg Oral QHS PRN Massengill, [place] Donath, MD       Melene Muller ON 09/15/2022] venlafaxine XR (EFFEXOR-XR) 24 hr capsule 37.5 mg  37.5 mg Oral Q breakfast Massengill, [place] Donath, MD       Followed by   Melene Muller ON 09/16/2022] venlafaxine XR (EFFEXOR-XR) 24 hr capsule 75 mg  75 mg Oral Q breakfast Massengill, Nathan, MD       PTA Medications: Medications Prior to Admission  Medication Sig Dispense Refill Last Dose   buPROPion (WELLBUTRIN XL) 300 MG 24 hr tablet Take 300 mg by mouth every morning.      busPIRone (BUSPAR) 30 MG tablet Take 1 tablet by mouth 2 (two) times daily.      Omega-3 Fatty Acids (FISH OIL) 1000 MG CAPS Take 1 capsule by mouth every morning.      propranolol (INDERAL) 20 MG tablet Take 20 mg by mouth 3 (three) times daily as needed.      QUEtiapine (SEROQUEL XR) 300 MG 24 hr tablet Take 300 mg by mouth at bedtime.      sertraline (ZOLOFT) 100 MG tablet Take 200 mg by mouth every morning.      Musculoskeletal: Strength & Muscle Tone: within normal limits Gait & Station: normal Patient leans: N/A Psychiatric Specialty Exam:  Presentation  General Appearance: Appropriate for Environment; Fairly Groomed  Eye Contact:Minimal  Speech:Clear and Coherent  Speech Volume:Normal  Handedness:Right   Mood and Affect  Mood:Anxious; Depressed  Affect:Congruent   Thought Process  Thought Processes:Coherent  Duration of Psychotic Symptoms: No data recorded Past  Diagnosis of Schizophrenia or Psychoactive disorder: No  Descriptions of Associations:Intact  Orientation:Full (Time, Place and Person)  Thought Content:Logical  Hallucinations:Hallucinations: None  Ideas of Reference:None  Suicidal Thoughts:Suicidal Thoughts: No  Homicidal Thoughts:Homicidal Thoughts: No   Sensorium  Memory:Immediate Good  Judgment:Fair  Insight:Fair   Executive Functions  Concentration:Fair  Attention Span:Fair  Recall:Fair  Fund of Knowledge:Fair  Language:Fair   Psychomotor Activity  Psychomotor Activity:Psychomotor Activity: Normal   Assets  Assets:Communication Skills   Sleep  Sleep:Sleep: Poor    Physical Exam: Physical Exam Constitutional:      Appearance: Normal appearance.  HENT:     Head: Normocephalic.     Nose: No congestion or rhinorrhea.  Eyes:     Pupils: Pupils are equal, round, and reactive to light.  Pulmonary:     Effort: Pulmonary effort is normal.  Musculoskeletal:        General: Normal range of motion.     Cervical back: Normal range of motion.  Neurological:     Mental Status: He is alert and oriented to person, place, and time.     Sensory: No sensory deficit.     Coordination: Coordination normal.  Psychiatric:        Behavior: Behavior normal.        Thought Content: Thought content normal.    Review of Systems  Constitutional: Negative.  Negative for fever.  HENT: Negative.    Eyes: Negative.  Respiratory: Negative.    Cardiovascular: Negative.   Gastrointestinal: Negative.   Genitourinary: Negative.   Musculoskeletal: Negative.   Skin: Negative.   Neurological: Negative.   Psychiatric/Behavioral:  Positive for depression and substance abuse. Negative for hallucinations, memory loss and suicidal ideas. The patient is nervous/anxious and has insomnia.    Blood pressure 116/85, pulse (!) 109, temperature (!) 97.5 F (36.4 C), temperature source Oral, resp. rate 20, height 6\' 2"  (1.88  m), weight 81.6 kg, SpO2 97 %. Body mass index is 23.11 kg/m.  Treatment Plan Summary: Daily contact with patient to assess and evaluate symptoms and progress in treatment and Medication management  Observation Level/Precautions:  15 minute checks  Laboratory:  Labs reviewed   Psychotherapy:  Unit Group sessions  Medications:  See [facility]  Consultations:  To be determined   Discharge Concerns:  Safety, medication compliance, mood stability  Estimated LOS: 5-7 days  Other:  N/A   PLAN Safety and Monitoring: Involuntary admission to inpatient psychiatric unit for safety, stabilization and treatment Daily contact with patient to assess and evaluate symptoms and progress in treatment Patient's case to be discussed in multi-disciplinary team meeting Observation Level : q15 minute checks Vital signs: q12 hours Precautions: Safety  Long Term Goal(s): Improvement in symptoms so as ready for discharge  Short Term Goals: Ability to identify changes in lifestyle to reduce recurrence of condition will improve, Ability to verbalize feelings will improve, Ability to disclose and discuss suicidal ideas, Ability to demonstrate self-control will improve, Ability to identify and develop effective coping behaviors will improve, Compliance with prescribed medications will improve, and Ability to identify triggers associated with substance abuse/mental health issues will improve  Diagnoses  Principal Problem:   MDD (major depressive disorder), severe (HCC) Active Problems:   Alcohol use disorder   Stimulant use disorder   Insomnia   Delta-9-tetrahydrocannabinol (THC) dependence (HCC)   Attention deficit hyperactivity disorder (ADHD)   GAD (generalized anxiety disorder)   Social anxiety disorder  Medications Plan -Taper sertraline from 200 mg to 150 mg once daily today, then decrease to 100 mg for 1 day, then to 50 mg for one day, then stop.  -Start effexor 37.5 mg once daily tomorrow (10/19), then  increase to 75 mg once daily for MDD and anxiety. - Change buspar from 30 mg bid to 20 mg bid for anxiety.  -Continue wellbutrin xl 300 mg once daily for ADHD & depression.  -Continue propranolol 20 mg q8H for anxiety.  -Decrease seroquel to 100 mg qhs for mood stabilization.  -Start mirtazapine 15 mg qhs for sleep, appetite stimulation and sleep.  -Start acamprosate 666 mg tid for alcohol use disorder (pt reports naltrexone previously made him more depressed)  Other PRNS -Continue Tylenol 650 mg every 6 hours PRN for mild pain -Continue Maalox 30 mg every 4 hrs PRN for indigestion -Continue Milk of Magnesia as needed every 6 hrs for constipation  Labs reviewed: LFTs elevated (AST-155, ALT-71), will recheck CMP. CBC WNL. Orders placed for HA1C, TSH, Vitamins D, B12 and baseline UA. EKG WNL with Qtc-416.  Discharge Planning: Social work and case management to assist with discharge planning and identification of [facility] follow-up needs prior to discharge Estimated LOS: 5-7 days Discharge Concerns: Need to establish a safety plan; Medication compliance and effectiveness Discharge Goals: Return home with outpatient referrals for mental health follow-up including medication management/psychotherapy  I certify that inpatient services furnished can reasonably be expected to improve the patient's condition.    Alishah Schulte  Meisha Salone, NP 10/18/20236:56 PM

## 2022-09-14 NOTE — BHH Suicide Risk Assessment (Signed)
[place] INPATIENT:  Family/Significant Other Suicide Prevention Education  Suicide Prevention Education:  Contact Attempts: Terrel Nesheiwat, mother , [phone] has been identified by the patient as the family member/significant other with whom the patient will be residing, and identified as the person(s) who will aid the patient in the event of a mental health crisis.  With written consent from the patient, two attempts were made to provide suicide prevention education, prior to and/or following the patient's discharge.  We were unsuccessful in providing suicide prevention education.  A suicide education pamphlet was given to the patient to share with family/significant other.  Date and time of first attempt: 09/14/2022 at 11:26 AM  Date and time of second attempt: CSW team will make additional attempt to reach collateral.   Durenda Hurt 09/14/2022, 11:26 AM

## 2022-09-14 NOTE — BHH Group Notes (Signed)
Patients were given education on signs and symptoms of behavioral dysregulation. Patients were then asked to identify using reflection signs they were not coping well, and identify healthy support systems.  Patient participated and attended group.  

## 2022-09-14 NOTE — Progress Notes (Signed)
   09/14/22 0545  Sleep  Number of Hours 7.75

## 2022-09-14 NOTE — Progress Notes (Signed)
   09/14/22 1319  Psych Admission Type (Psych Patients Only)  Admission Status Involuntary  Psychosocial Assessment  Patient Complaints Anxiety  Eye Contact Fair  Facial Expression Flat  Affect Anxious  Speech Logical/coherent  Interaction Assertive  Motor Activity Other (Comment) (WNL)  Appearance/Hygiene Unremarkable  Behavior Characteristics Cooperative  Mood Anxious  Thought Process  Coherency WDL  Content WDL  Delusions None reported or observed  Perception WDL  Hallucination Auditory  Judgment WDL  Confusion None  Danger to Self  Current suicidal ideation? Denies  Self-Injurious Behavior No self-injurious ideation or behavior indicators observed or expressed   Agreement Not to Harm Self Yes  Description of Agreement verbal  Danger to Others  Danger to Others None reported or observed

## 2022-09-14 NOTE — [facility] Group Notes (Signed)
Adult Psychoeducational Group Note  Date:  09/14/2022 Time:  11:54 AM  Group Topic/Focus:  Goals Group:   The focus of this group is to help patients establish daily goals to achieve during treatment and discuss how the patient can incorporate goal setting into their daily lives to aide in recovery.  Participation Level:  Did Not Attend  Participation Quality:   Did not attend  Affect:   Did not attend   Cognitive:   Did not attend  Insight: None  Engagement in Group:   Did not attend  Modes of Intervention:   Did not attend  Additional Comments:  Did not attend Goal group.  Fransheska Willingham, Georgiann Mccoy 09/14/2022, 11:54 AM

## 2022-09-14 NOTE — BH IP Treatment Plan (Signed)
Interdisciplinary Treatment and Diagnostic Plan Update  09/14/2022 Time of Session: [place] MRN: [id number]  Principal Diagnosis: MDD (major depressive disorder), severe ([place])  Secondary Diagnoses: Principal Problem:   MDD (major depressive disorder), severe (HCC)   Current Medications:  Current Facility-Administered Medications  Medication Dose Route Frequency Provider Last Rate Last Admin   acetaminophen (TYLENOL) tablet 650 mg  650 mg Oral Q6H PRN Leevy-Johnson, Brooke A, NP       alum & mag hydroxide-simeth (MAALOX/MYLANTA) 200-200-20 MG/5ML suspension 30 mL  30 mL Oral Q4H PRN Leevy-Johnson, Brooke A, NP       magnesium hydroxide (MILK OF MAGNESIA) suspension 30 mL  30 mL Oral Daily PRN Leevy-Johnson, Brooke A, NP       nicotine (NICODERM CQ - dosed in mg/24 hours) patch 21 mg  21 mg Transdermal Daily Nkwenti, Doris, NP   21 mg at 09/14/22 1112   QUEtiapine (SEROQUEL) tablet 300 mg  300 mg Oral QHS Evette Georges, NP   300 mg at 09/13/22 2125   PTA Medications: Medications Prior to Admission  Medication Sig Dispense Refill Last Dose   buPROPion (WELLBUTRIN XL) 300 MG 24 hr tablet Take 300 mg by mouth every morning.      busPIRone (BUSPAR) 30 MG tablet Take 1 tablet by mouth 2 (two) times daily.      Omega-3 Fatty Acids (FISH OIL) 1000 MG CAPS Take 1 capsule by mouth every morning.      propranolol (INDERAL) 20 MG tablet Take 20 mg by mouth 3 (three) times daily as needed.      QUEtiapine (SEROQUEL XR) 300 MG 24 hr tablet Take 300 mg by mouth at bedtime.      sertraline (ZOLOFT) 100 MG tablet Take 200 mg by mouth every morning.       Patient Stressors: Financial difficulties   Substance abuse    Patient Strengths: Motivation for treatment/growth  Physical Health  Supportive family/friends   Treatment Modalities: Medication Management, Group therapy, Case management,  1 to 1 session with clinician, Psychoeducation, Recreational therapy.   Physician Treatment  Plan for Primary Diagnosis: MDD (major depressive disorder), severe ([place]) Long Term Goal(s):     Short Term Goals:    Medication Management: Evaluate patient's response, side effects, and tolerance of medication regimen.  Therapeutic Interventions: 1 to 1 sessions, Unit Group sessions and Medication administration.  Evaluation of Outcomes: Progressing  Physician Treatment Plan for Secondary Diagnosis: Principal Problem:   MDD (major depressive disorder), severe ([place])  Long Term Goal(s):     Short Term Goals:       Medication Management: Evaluate patient's response, side effects, and tolerance of medication regimen.  Therapeutic Interventions: 1 to 1 sessions, Unit Group sessions and Medication administration.  Evaluation of Outcomes: Progressing   RN Treatment Plan for Primary Diagnosis: MDD (major depressive disorder), severe ([place]) Long Term Goal(s): Knowledge of disease and therapeutic regimen to maintain health will improve  Short Term Goals: Ability to remain free from injury will improve, Ability to verbalize frustration and anger appropriately will improve, Ability to demonstrate self-control, Ability to participate in decision making will improve, Ability to verbalize feelings will improve, Ability to disclose and discuss suicidal ideas, Ability to identify and develop effective coping behaviors will improve, and Compliance with prescribed medications will improve  Medication Management: RN will administer medications as ordered by provider, will assess and evaluate patient's response and provide education to patient for prescribed medication. RN will report any adverse and/or side effects  to prescribing provider.  Therapeutic Interventions: 1 on 1 counseling sessions, Psychoeducation, Medication administration, Evaluate responses to treatment, Monitor vital signs and CBGs as ordered, Perform/monitor CIWA, COWS, AIMS and Fall Risk screenings as ordered, Perform wound care  treatments as ordered.  Evaluation of Outcomes: Progressing   LCSW Treatment Plan for Primary Diagnosis: MDD (major depressive disorder), severe (HCC) Long Term Goal(s): Safe transition to appropriate next level of care at discharge, Engage patient in therapeutic group addressing interpersonal concerns.  Short Term Goals: Engage patient in aftercare planning with referrals and resources, Increase social support, Increase ability to appropriately verbalize feelings, Increase emotional regulation, Facilitate acceptance of mental health diagnosis and concerns, Facilitate patient progression through stages of change regarding substance use diagnoses and concerns, Identify triggers associated with mental health/substance abuse issues, and Increase skills for wellness and recovery  Therapeutic Interventions: Assess for all discharge needs, 1 to 1 time with Social worker, Explore available resources and support systems, Assess for adequacy in community support network, Educate family and significant other(s) on suicide prevention, Complete Psychosocial Assessment, Interpersonal group therapy.  Evaluation of Outcomes: Progressing   Progress in Treatment: Attending groups: No. Participating in groups: No. Taking medication as prescribed: Yes. Toleration medication: Yes. Family/Significant other contact made: No, will contact:  CSW will make additional attempt to reach Carlton Adam, mother, [phone]  Patient understands diagnosis: Yes. Discussing patient identified problems/goals with staff: Yes. Medical problems stabilized or resolved: Yes. Denies suicidal/homicidal ideation: No. Issues/concerns per patient self-inventory: Yes. Other: none  New problem(s) identified: No, Describe:  none  New Short Term/Long Term Goal(s): Patient to work towards detox, medication management for mood stabilization; elimination of SI thoughts; development of comprehensive mental wellness/sobriety  plan.  Patient Goals:  Patient states their goal for treatment is to "be happier and be prepared to go to another facility."  Discharge Plan or Barriers: No psychosocial barriers identified at this time, patient to return to place of residence when appropriate for discharge.   Reason for Continuation of Hospitalization: Depression Medication stabilization Suicidal ideation  Estimated Length of Stay: 1-7 days    Scribe for Treatment Team: Almedia Balls 09/14/2022 1:03 PM

## 2022-09-14 NOTE — Plan of Care (Signed)
  Problem: Activity: Goal: Interest or engagement in activities will improve Outcome: Progressing   Problem: Coping: Goal: Ability to verbalize frustrations and anger appropriately will improve Outcome: Progressing   Problem: Coping: Goal: Ability to demonstrate self-control will improve Outcome: Progressing   Problem: Safety: Goal: Periods of time without injury will increase Outcome: Progressing   

## 2022-09-14 NOTE — Group Note (Signed)
Recreation Therapy Group Note   Group Topic:Stress Management  Group Date: 09/14/2022 Start Time: 0930 End Time: 0950 Facilitators: Macy Polio-McCall, LRT,CTRS Location: [address] Dayroom   Goal Area(s) Addresses:  Patient will identify positive stress management techniques. Patient will identify benefits of using stress management post d/c.  Group Description:  Meditation.  LRT played a meditation that focused on having a mindful morning.  Patients were to listen and follow along as meditation played to fully engage in the group session.  Patients were to relax and get as comfortable as possible to get the full experience of the meditation.   Affect/Mood: N/A   Participation Level: Did not attend    Clinical Observations/Individualized Feedback:     Plan: Continue to engage patient in RT group sessions 2-3x/week.   Barbi Kumagai-McCall, LRT,CTRS 09/14/2022 12:04 PM

## 2022-09-14 NOTE — BHH Suicide Risk Assessment (Signed)
Suicide Risk Assessment  Admission Assessment    [facility] Admission Suicide Risk Assessment   Nursing information obtained from:  Patient Demographic factors:  Male, Low socioeconomic status, Unemployed Current Mental Status:  Self-harm thoughts Loss Factors:  Decrease in vocational status, Financial problems / change in socioeconomic status Historical Factors:  NA Risk Reduction Factors:  Living with another person, especially a relative  Total Time spent with patient: 1.5 hours Principal Problem: MDD (major depressive disorder), severe (HCC) Diagnosis:  Principal Problem:   MDD (major depressive disorder), severe (HCC) Active Problems:   Alcohol use disorder   Stimulant use disorder   Insomnia   Delta-9-tetrahydrocannabinol (THC) dependence (HCC)   Attention deficit hyperactivity disorder (ADHD)   GAD (generalized anxiety disorder)   Social anxiety disorder  HPI:  Francisco Yates is a 21 yo Caucasian male with prior mental health diagnoses of ADHD, MDD, GAD, polysubstance use  (ETOH, THC, amphetamines)  & social anxiety d/o who was IVC'd [facility] for suicidal ideations with plan to overdose on Co2 or nitrous oxide. Pt was transported to the [facility] ER by [employer], and was subsequently transferred to this [facility] for treatment and stabilization of his mood.   Continued Clinical Symptoms:   Patient reports a poor self esteem, reports feeling like a failure, reports poor sleep for the past couple of years, worsening over the past months.  Patient reports decreased energy levels, poor concentration levels, irritability, poor appetite, anger, feelings of frustration, and hopelessness.  He reports feeling like he will never be free of depression. Continuous hospitalization required to treat and stabilize mood.   The "Alcohol Use Disorders Identification Test", Guidelines for Use in Primary Care, Second Edition.  World Science writer [facility]). Score  between 0-7:  no or low risk or alcohol related problems. Score between 8-15:  moderate risk of alcohol related problems. Score between 16-19:  high risk of alcohol related problems. Score 20 or above:  warrants further diagnostic evaluation for alcohol dependence and treatment.  CLINICAL FACTORS:   Depression:   Comorbid alcohol abuse/dependence Hopelessness Insomnia Recent sense of peace/wellbeing Severe  Musculoskeletal: Strength & Muscle Tone: within normal limits Gait & Station: normal Patient leans: N/A  Psychiatric Specialty Exam:  Presentation  General Appearance:  Appropriate for Environment; Fairly Groomed  Eye Contact: Minimal  Speech: Clear and Coherent  Speech Volume: Normal  Handedness: Right   Mood and Affect  Mood: Anxious; Depressed  Affect: Congruent   Thought Process  Thought Processes: Coherent  Descriptions of Associations:Intact  Orientation:Full (Time, Place and Person)  Thought Content:Logical  History of Schizophrenia/Schizoaffective disorder:No  Duration of Psychotic Symptoms:No data recorded Hallucinations:Hallucinations: None  Ideas of Reference:None  Suicidal Thoughts:Suicidal Thoughts: No  Homicidal Thoughts:Homicidal Thoughts: No   Sensorium  Memory: Immediate Good  Judgment: Fair  Insight: Fair   Chartered certified accountant: Fair  Attention Span: Fair  Recall: [employer] of Knowledge: Fair  Language: Fair   Psychomotor Activity  Psychomotor Activity: Psychomotor Activity: Normal   Assets  Assets: Communication Skills   Sleep  Sleep: Sleep: Poor    Physical Exam: Physical Exam Constitutional:      Appearance: Normal appearance.  HENT:     Head: Normocephalic.     Nose: Nose normal. No congestion or rhinorrhea.  Eyes:     Pupils: Pupils are equal, round, and reactive to light.  Musculoskeletal:     Cervical back: Normal range of motion.  Neurological:  Mental Status: He is alert and oriented to person, place, and time.     Sensory: No sensory deficit.     Coordination: Coordination normal.  Psychiatric:        Behavior: Behavior normal.    Review of Systems  Constitutional: Negative.   HENT: Negative.    Eyes: Negative.   Respiratory: Negative.    Cardiovascular: Negative.   Gastrointestinal: Negative.   Genitourinary: Negative.   Musculoskeletal: Negative.   Skin: Negative.   Neurological: Negative.   Psychiatric/Behavioral:  Positive for depression and substance abuse. Negative for hallucinations, memory loss and suicidal ideas. The patient is nervous/anxious and has insomnia.    Blood pressure 116/85, pulse (!) 109, temperature (!) 97.5 F (36.4 C), temperature source Oral, resp. rate 20, height 6\' 2"  (1.88 m), weight 81.6 kg, SpO2 97 %. Body mass index is 23.11 kg/m.   COGNITIVE FEATURES THAT CONTRIBUTE TO RISK:  None    SUICIDE RISK:   Mild:  There are no identifiable suicide plans, no associated intent, mild dysphoria and related symptoms, good self-control (both objective and subjective assessment), few other risk factors, and identifiable protective factors, including available and accessible social support.  PLAN Safety and Monitoring: Involuntary admission to inpatient psychiatric unit for safety, stabilization and treatment Daily contact with patient to assess and evaluate symptoms and progress in treatment Patient's case to be discussed in multi-disciplinary team meeting Observation Level : q15 minute checks Vital signs: q12 hours Precautions: Safety   Long Term Goal(s): Improvement in symptoms so as ready for discharge   Short Term Goals: Ability to identify changes in lifestyle to reduce recurrence of condition will improve, Ability to verbalize feelings will improve, Ability to disclose and discuss suicidal ideas, Ability to demonstrate self-control will improve, Ability to identify and develop effective  coping behaviors will improve, Compliance with prescribed medications will improve, and Ability to identify triggers associated with substance abuse/mental health issues will improve   Diagnoses  Principal Problem:   MDD (major depressive disorder), severe (HCC) Active Problems:   Alcohol use disorder   Stimulant use disorder   Insomnia   Delta-9-tetrahydrocannabinol (THC) dependence (HCC)   Attention deficit hyperactivity disorder (ADHD)   GAD (generalized anxiety disorder)   Social anxiety disorder   Medications Plan -Taper sertraline from 200 mg to 150 mg once daily today, then decrease to 100 mg for 1 day, then to 50 mg for one day, then stop.  -Start effexor 37.5 mg once daily tomorrow (10/19), then increase to 75 mg once daily for MDD and anxiety. - Change buspar from 30 mg bid to 20 mg bid for anxiety.  -Continue wellbutrin xl 300 mg once daily for ADHD & depression.  -Continue propranolol 20 mg q8H for anxiety.  -Decrease seroquel to 100 mg qhs for mood stabilization.  -Start mirtazapine 15 mg qhs for sleep, appetite stimulation and sleep.  -Start acamprosate 666 mg tid for alcohol use disorder (pt reports naltrexone previously made him more depressed)   Other PRNS -Continue Tylenol 650 mg every 6 hours PRN for mild pain -Continue Maalox 30 mg every 4 hrs PRN for indigestion -Continue Milk of Magnesia as needed every 6 hrs for constipation   Labs reviewed: LFTs elevated (AST-155, ALT-71), will recheck CMP. CBC WNL. Orders placed for HA1C, TSH, Vitamins D, B12 and baseline UA. EKG WNL with Qtc-416.   Discharge Planning: Social work and case management to assist with discharge planning and identification of hospital follow-up needs prior to discharge Estimated LOS:  5-7 days Discharge Concerns: Need to establish a safety plan; Medication compliance and effectiveness Discharge Goals: Return home with outpatient referrals for mental health follow-up including medication  management/psychotherapy I certify that inpatient services furnished can reasonably be expected to improve the patient's condition.   Nicholes Rough, NP 09/14/2022, 7:03 PM

## 2022-09-14 NOTE — BHH Counselor (Signed)
Adult Comprehensive Assessment  Patient ID: Francisco Yates, male   DOB: [birth year], 21 y.o.   MRN: [id number]  Information Source: Information source: Patient  Current Stressors:  Patient states their primary concerns and needs for treatment are:: During assessment, patient states he was at [facility] when he made suicidal remarks w/ plan (asphyxiation) to a provider. Patient was subsequently transfered to hospital for psychiatric clearance. Patient states their goals for this hospitilization and ongoing recovery are:: States his goal for hospitalization is to "be happier and prepared to go to another facility (SUD residential treatment)." Educational / Learning stressors: none reported Employment / Job issues: patient is unemployed Family Relationships: repoprts difficulty connecting with his brother due to his dx of ASD Surveyor, quantity / Lack of resources (include bankruptcy): reports he is in depbt $1500 which causes him to worry Housing / Lack of housing: none reported Physical health (include injuries & life threatening diseases): none reported Social relationships: patient reports his social life is "non-existent" Substance abuse: see SUD section for full details, patient was attending Residential S[facility] treatment Bereavement / Loss: none reported  Living/Environment/Situation:  Living Arrangements: Parent Living conditions (as described by patient or guardian): states living conditions are WNL Who else lives in the home?: patient lives with mother and 2 brothers How long has patient lived in current situation?: 2 years What is atmosphere in current home: Comfortable, Supportive  Family History:  Marital status: Single Are you sexually active?: No What is your sexual orientation?: Heterosexual Does patient have children?: No  Childhood History:  By whom was/is the patient raised?: Both parents Additional childhood history information: parents divorced at 71 y/o Description of  patient's relationship with caregiver when they were a child: reports he has always been close with his mother, reports emotionally distant relationship with his father during childhood Patient's description of current relationship with people who raised him/her: reports his relationship with his father has improved since his parents divorced; relationship with his mother remains well How were you disciplined when you got in trouble as a child/adolescent?: not assessed Does patient have siblings?: Yes Number of Siblings: 2 Description of patient's current relationship with siblings: reports difficuly feeling connection wtih one of his brothers due to dx of ASD, reports good relationship with other brother Did patient suffer any verbal/emotional/physical/sexual abuse as a child?: No Did patient suffer from severe childhood neglect?: No Has patient ever been sexually abused/assaulted/raped as an adolescent or adult?: No Was the patient ever a victim of a crime or a disaster?: No Witnessed domestic violence?: No Has patient been affected by domestic violence as an adult?: No  Education:  Highest grade of school patient has completed: HS Diploma Currently a Consulting civil engineer?: No Learning disability?: No  Employment/Work Situation:   Employment Situation: Unemployed (2 months) Patient's Job has Been Impacted by Current Illness: Yes Describe how Patient's Job has Been Impacted: reports he feels too anxious to address conflict at work, so he quit his job instead What is the Longest Time Patient has Held a Job?: 9 months Where was the Patient Employed at that Time?: [employer] Has Patient ever Been in the [employer]?: No  Financial Resources:   Financial resources: No income, Media planner Does patient have a Lawyer or guardian?: No  Alcohol/Substance Abuse:   Social History   Substance and Sexual Activity  Alcohol Use Yes   Comment: reports previosly he would drink 8-10  standard drinks 3-4x weekly, since previous inpatient rehab sting in June, alcohol use has  decreased to 6 stand drinks 1x weekly   Social History   Substance and Sexual Activity  Drug Use Yes   Types: Marijuana   Comment: reports reduciton in [facility] ([facility]) use from 3-4x weekly to 1x weekly since prior rehab stay in Jun of 2023   Tobacco Use: High Risk (09/14/2022)   Patient History    Smoking Tobacco Use: Every Day    Smokeless Tobacco Use: Never    Passive Exposure: Not on file   If attempted suicide, did drugs/alcohol play a role in this?: No Alcohol/Substance Abuse Treatment Hx: Past Tx, Inpatient Has alcohol/substance abuse ever caused legal problems?: Yes (DUI, date unknown)  Social Support System:   Patient's Community Support System: Manufacturing engineer System: lists his family as supportive of his mental health Type of faith/religion: none How does patient's faith help to cope with current illness?: n/a  Leisure/Recreation:   Do You Have Hobbies?: Yes Leisure and Hobbies: "Video games, weight lifting, and running"  Strengths/Needs:   Patient states these barriers may affect/interfere with their treatment: none reported Patient states these barriers may affect their return to the community: none reported Other important information patient would like considered in planning for their treatment: none reported  Discharge Plan:   Currently receiving community mental health services: Yes (From Whom) (Dr Maryln Gottron w/ [facility] in [place]) Does patient have access to transportation?: No Does patient have financial barriers related to discharge medications?: No [employer] ins) Will patient be returning to same living situation after discharge?:  (TBD, patient may return to [facility] or to [facility] residence)  Summary/Recommendations:   Architectural technologist and Recommendations (to be completed by the evaluator): 21 y/o male w/ dx of MDD recurrent severe, w/ out psychotic  features comorbid with poly substance use d/o, severe (alcohol and cannabis) from [facility] w/ [place] ins admitted due to worsening suicidal ideation; unable to contract for safety.  During assessment, patient states he was at [facility] when he made suicidal remarks w/ plan (asphyxiation) to a provider. Patient was subsequently transfered to hospital for psychiatric clearance. States his goal for hospitalization is to "be happier and prepared to go to another facility (SUD residential treatment)."  Patient presents as calm, cooperative, and polite. Affect is depressed, congruent with mood and context. Appearance is WNL. Speech volume, speed, and content is WNL. No evidence of memory or concentration impairment. Patient oriented to person, place, time, and situation. Currently denies HI, AVH. No evidence of psychotic features present.    Patient is seen by Dr. Maryln Gottron at [facility] behavioral health in [facility]; has signed consent for [place] to share medical records with outpatient providers. Therapeutic recommendations include further crisis stabilization, medication management, group therapy, and case management.   Durenda Hurt. 09/14/2022

## 2022-09-15 DIAGNOSIS — F322 Major depressive disorder, single episode, severe without psychotic features: Principal | ICD-10-CM

## 2022-09-15 LAB — COMPREHENSIVE METABOLIC PANEL
ALT: 48 U/L — ABNORMAL HIGH (ref 0–44)
AST: 52 U/L — ABNORMAL HIGH (ref 15–41)
Albumin: 4.3 g/dL (ref 3.5–5.0)
Alkaline Phosphatase: 57 U/L (ref 38–126)
Anion gap: 4 — ABNORMAL LOW (ref 5–15)
BUN: 14 mg/dL (ref 6–20)
CO2: 29 mmol/L (ref 22–32)
Calcium: 9 mg/dL (ref 8.9–10.3)
Chloride: 106 mmol/L (ref 98–111)
Creatinine, Ser: 0.81 mg/dL (ref 0.61–1.24)
GFR, Estimated: >60 mL/min (ref >60)
Glucose, Bld: 100 mg/dL — ABNORMAL HIGH (ref 70–99)
Potassium: 3.8 mmol/L (ref 3.5–5.1)
Sodium: 139 mmol/L (ref 135–145)
Total Bilirubin: 0.6 mg/dL (ref 0.3–1.2)
Total Protein: 7.2 g/dL (ref 6.5–8.1)

## 2022-09-15 LAB — URINALYSIS, COMPLETE (UACMP) WITH MICROSCOPIC
Bacteria, UA: NONE SEEN
Bilirubin Urine: NEGATIVE
Glucose, UA: NEGATIVE mg/dL
Hgb urine dipstick: NEGATIVE
Ketones, ur: NEGATIVE mg/dL
Leukocytes,Ua: NEGATIVE
Nitrite: NEGATIVE
Protein, ur: NEGATIVE mg/dL
Specific Gravity, Urine: 1.002 — ABNORMAL LOW (ref 1.005–1.030)
pH: 8 (ref 5.0–8.0)

## 2022-09-15 LAB — LIPID PANEL
Cholesterol: 184 mg/dL (ref 0–200)
HDL: 50 mg/dL (ref >40)
LDL Cholesterol: 113 mg/dL — ABNORMAL HIGH (ref 0–99)
Total CHOL/HDL Ratio: 3.7 RATIO
Triglycerides: 104 mg/dL (ref <150)
VLDL: 21 mg/dL (ref 0–40)

## 2022-09-15 LAB — TSH: TSH: 1.211 u[IU]/mL (ref 0.350–4.500)

## 2022-09-15 LAB — HEMOGLOBIN A1C
Hgb A1c MFr Bld: 4.5 % — ABNORMAL LOW (ref 4.8–5.6)
Mean Plasma Glucose: 82.45 mg/dL

## 2022-09-15 MED ORDER — NICOTINE 14 MG/24HR TD PT24
14.0000 mg | MEDICATED_PATCH | Freq: Every day | TRANSDERMAL | Status: DC
  Administered 2022-09-16: 14 mg via TRANSDERMAL
  Filled 2022-09-15 (×2): qty 1

## 2022-09-15 MED ORDER — POLYVINYL ALCOHOL 1.4 % OP SOLN: 1.0000 [drp] | OPHTHALMIC | Status: DC | PRN

## 2022-09-15 MED ORDER — NICOTINE POLACRILEX 2 MG MT GUM: 2.0000 mg | CHEWING_GUM | OROMUCOSAL | Status: DC | PRN

## 2022-09-15 NOTE — Plan of Care (Signed)
  Problem: Education: Goal: Emotional status will improve Outcome: Progressing Goal: Mental status will improve Outcome: Progressing   Problem: Activity: Goal: Sleeping patterns will improve Outcome: Progressing   

## 2022-09-15 NOTE — Progress Notes (Signed)
[place] Group Notes:  (Nursing/MHT/Case Management/Adjunct)  Date:  09/15/2022  Time:  2000  Type of Therapy:   wrap up group  Participation Level:  Active  Participation Quality:  Appropriate, Attentive, Sharing, and Supportive  Affect:  Flat  Cognitive:  Alert  Insight:  Improving  Engagement in Group:  Engaged  Modes of Intervention:  Clarification, Education, and Support  Summary of Progress/Problems: Positive thinking and positive change were discussed.   [name] 09/15/2022, 8:50 PM

## 2022-09-15 NOTE — BHH Counselor (Signed)
CSW spoke with an admissions representative at [facility] [phone] who states that since the individual was IVC'd with suicidal thoughts "He will need a higher level of care and cannot return to this program".  CSW will inform the Pt and the providers.

## 2022-09-15 NOTE — Progress Notes (Signed)
Adult Psychoeducational Group Note  Date:  09/15/2022 Time:  11:02 AM  Group Topic/Focus:  Orientation:   The focus of this group is to educate the patient on the purpose and policies of crisis stabilization and provide a format to answer questions about their admission.  The group details unit policies and expectations of patients while admitted.  Pt did not attend orientation group.

## 2022-09-15 NOTE — Progress Notes (Signed)
[facility] MD Progress Note  09/15/2022 11:13 AM Francisco Yates  MRN:  [id number] Subjective:  "I'm okay. 6/10 today"  History of Present Illness: Francisco Yates is a 21 yo Caucasian male with past psychiatric history of  ADHD, MDD, GAD, polysubstance abuse  (ETOH, THC, amphetamines), and social anxiety disorder who presented to [facility] under IVC from [place] for suicidal ideations with plan to overdose on Co2 or nitrous oxide. Per chart review, x1 other ED encounter in 2019 where patient presented to [facility] for overdose involving ETOH and Xanax intent, undetermined; he was hospitalized at [facility] as a result. No other hospitalizations noted. Family history includes mother MDD, maternal aunt bipolar, substance abuse completed suicide via jumping from building (2007). On admission to [facility] patient reported, 'feeling like a burden on the ward.  Society expects me to do more, but I feel like I have nothing to offer, and I have not started giving back."  Patient reports a poor self esteem, reports feeling like a failure, reports poor sleep for the past couple of years, worsening over the past months'.    Past 24 hours: Patient intermittently present in milieu, participating in group and other unit activities. Did not attend evening NA meeting. Medication compliant; continues on Campral for alcohol use disorder treatment. PRN Tylenol given for mild pain. Remains on Sertraline taper; Venlafaxine 37.5 mg to be increased to 75 mg 10/20, Wellbutrin XR 300 mg started this am. Slept 5.75 hours during the night.    Assessment: On assessment patient presents in his room laying down. Euthymic mood; congruent affect, brightens on approach.  He reports depression 6/10 today with no active suicidal ideations. He reports ongoing "weird dreams" related to Nicotine Patch; states last night's "wasn't that bad", denies any insomnia. Reports ongoing cravings for nicotine, denies any for illicit  substance or alcohol at this time. Discussed medications including the new addition of  Wellbutrin and possibly adding nicotine gum in the meantime to manage cravings, he agreeable. He reports some drowsiness and dry eyes; discussed adding prn eye drops and evaluating night time medication regimen. He denies any intent or plan to harm himself or safety concerns at this time; reports feeling safe on unit, "food is good and the unit is good". He currently lives with parents in [place]; mom visited earlier in the week, father visited on yesterday; states interactions "have been great". Shallow insight regarding his substance use, he denies any active cravings and states, "I'm done with drugs but I can't promise I'll never have alcohol again because I'm on 20". Denies any tremor, nausea, vomiting, auditory/visual hallucinations. States he is not interested in returning to [employer]. Contracts for safety at this time.  Principal Problem: MDD (major depressive disorder), severe ([place]) Diagnosis: Principal Problem:   MDD (major depressive disorder), severe (HCC) Active Problems:   Alcohol use disorder   Stimulant use disorder   Insomnia   Delta-9-tetrahydrocannabinol (THC) dependence (HCC)   Attention deficit hyperactivity disorder (ADHD)   GAD (generalized anxiety disorder)   Social anxiety disorder  Total Time spent with patient: 30 minutes  Past Psychiatric History: see above  Past Medical History: History reviewed. No pertinent past medical history. History reviewed. No pertinent surgical history. Family History: History reviewed. No pertinent family history. Family Psychiatric History: mother- MDD, (m) aunt- bipolar, substance abuse, completed suicide (2007)  Social History:  Social History   Substance and Sexual Activity  Alcohol Use Yes   Comment: reports previosly he would  drink 8-10 standard drinks 3-4x weekly, since previous inpatient rehab sting in June, alcohol use has decreased to  6 stand drinks 1x weekly     Social History   Substance and Sexual Activity  Drug Use Yes   Types: Marijuana   Comment: [facility] use from 3-4x weekly to 1x weekly since prior rehab stay in Jun of 2023    Social History   Socioeconomic History   Marital status: Single    Spouse name: Not on file   Number of children: Not on file   Years of education: Not on file   Highest education level: Not on file  Occupational History   Not on file  Tobacco Use   Smoking status: Every Day    Packs/day: 0.25    Types: Cigarettes   Smokeless tobacco: Never  Substance and Sexual Activity   Alcohol use: Yes    Comment: reports previosly he would drink 8-10 standard drinks 3-4x weekly, since previous inpatient rehab sting in June, alcohol use has decreased to 6 stand drinks 1x weekly   Drug use: Yes    Types: Marijuana    Comment: [facility] use from 3-4x weekly to 1x weekly since prior rehab stay in Jun of 2023   Sexual activity: Not Currently    Partners: Female  Other Topics Concern   Not on file  Social History Narrative   Not on file   Social Determinants of Health   Financial Resource Strain: Not on file  Food Insecurity: Unknown (09/13/2022)   Hunger Vital Sign    Worried About Running Out of Food in the Last Year: Patient refused    Ran Out of Food in the Last Year: Patient refused  Transportation Needs: Unknown (09/13/2022)   PRAPARE - Administrator, Civil ServiceTransportation    Lack of Transportation (Medical): Patient refused    Lack of Transportation (Non-Medical): Patient refused  Physical Activity: Not on file  Stress: Not on file  Social Connections: Not on file   Additional Social History:    Sleep: Fair  Appetite:  Good  Current Medications: Current Facility-Administered Medications  Medication Dose Route Frequency Provider Last Rate Last Admin   acamprosate (CAMPRAL) tablet 666 mg  666 mg Oral TID WC Massengill, Harrold DonathNathan, MD   666 mg at  09/15/22 [id number]   acetaminophen (TYLENOL) tablet 650 mg  650 mg Oral Q6H PRN Leevy-Johnson, Janzen Sacks A, NP   650 mg at 09/15/22 0629   alum & mag hydroxide-simeth (MAALOX/MYLANTA) 200-200-20 MG/5ML suspension 30 mL  30 mL Oral Q4H PRN Leevy-Johnson, Yvonne Petite A, NP       buPROPion (WELLBUTRIN XL) 24 hr tablet 300 mg  300 mg Oral Daily Massengill, Nathan, MD   300 mg at 09/15/22 0800   busPIRone (BUSPAR) tablet 20 mg  20 mg Oral Q8H Massengill, Nathan, MD   20 mg at 09/15/22 [id number]   hydrOXYzine (ATARAX) tablet 25 mg  25 mg Oral TID PRN Massengill, Harrold DonathNathan, MD       magnesium hydroxide (MILK OF MAGNESIA) suspension 30 mL  30 mL Oral Daily PRN Leevy-Johnson, Tyler Cubit A, NP       mirtazapine (REMERON SOL-TAB) disintegrating tablet 15 mg  15 mg Oral QHS Massengill, Nathan, MD   15 mg at 09/14/22 2135   nicotine (NICODERM CQ - dosed in mg/24 hours) patch 21 mg  21 mg Transdermal Daily Starleen BlueNkwenti, Doris, NP   21 mg at 09/15/22 0802   nicotine polacrilex (NICORETTE) gum 2  mg  2 mg Oral PRN Leevy-Johnson, Kam Rahimi A, NP       polyvinyl alcohol (LIQUIFILM TEARS) 1.4 % ophthalmic solution 1 drop  1 drop Both Eyes PRN Leevy-Johnson, Eliane Hammersmith A, NP       propranolol (INDERAL) tablet 20 mg  20 mg Oral Q8H Massengill, Nathan, MD   20 mg at 09/15/22 0627   [START ON 09/16/2022] sertraline (ZOLOFT) tablet 50 mg  50 mg Oral Daily Massengill, Nathan, MD       traZODone (DESYREL) tablet 50 mg  50 mg Oral QHS PRN Massengill, Harrold Donath, MD       Melene Muller ON 09/16/2022] venlafaxine XR (EFFEXOR-XR) 24 hr capsule 75 mg  75 mg Oral Q breakfast Massengill, Nathan, MD        Lab Results:  Results for orders placed or performed during the hospital encounter of 09/13/22 (from the past 48 hour(s))  Comprehensive metabolic panel     Status: Abnormal   Collection Time: 09/15/22  6:40 AM  Result Value Ref Range   Sodium 139 135 - 145 mmol/L   Potassium 3.8 3.5 - 5.1 mmol/L   Chloride 106 98 - 111 mmol/L   CO2 29 22 - 32 mmol/L   Glucose, Bld 100  (H) 70 - 99 mg/dL    Comment: Glucose reference range applies only to samples taken after fasting for at least 8 hours.   BUN 14 6 - 20 mg/dL   Creatinine, Ser 6.78 0.61 - 1.24 mg/dL   Calcium 9.0 8.9 - 93.8 mg/dL   Total Protein 7.2 6.5 - 8.1 g/dL   Albumin 4.3 3.5 - 5.0 g/dL   AST 52 (H) 15 - 41 U/L   ALT 48 (H) 0 - 44 U/L   Alkaline Phosphatase 57 38 - 126 U/L   Total Bilirubin 0.6 0.3 - 1.2 mg/dL   GFR, Estimated >10 >17 mL/min    Comment: (NOTE) Calculated using the CKD-EPI Creatinine Equation (2021)    Anion gap 4 (L) 5 - 15    Comment: Performed at [facility], [address] [place], [place] [address]  Hemoglobin A1c     Status: Abnormal   Collection Time: 09/15/22  6:40 AM  Result Value Ref Range   Hgb A1c MFr Bld 4.5 (L) 4.8 - 5.6 %    Comment: (NOTE) Pre diabetes:          5.7%-6.4%  Diabetes:              >6.4%  Glycemic control for   <7.0% adults with diabetes    Mean Plasma Glucose 82.45 mg/dL    Comment: Performed at [facility], [address] [place], [place] [address]  TSH     Status: None   Collection Time: 09/15/22  6:40 AM  Result Value Ref Range   TSH 1.211 0.350 - 4.500 uIU/mL    Comment: Performed by a 3rd Generation assay with a functional sensitivity of <=0.01 uIU/mL. Performed at [facility], [address], [place], [place] [address]   Lipid panel     Status: Abnormal   Collection Time: 09/15/22  6:40 AM  Result Value Ref Range   Cholesterol 184 0 - 200 mg/dL   Triglycerides 536 <144 mg/dL   HDL 50 >31 mg/dL   Total CHOL/HDL Ratio 3.7 RATIO   VLDL 21 0 - 40 mg/dL   LDL Cholesterol 540 (H) 0 - 99 mg/dL    Comment:  Total Cholesterol/HDL:CHD Risk Coronary Heart Disease Risk Table                     Men   Women  1/2 Average Risk   3.4   3.3  Average Risk       5.0   4.4  2 X Average Risk   9.6   7.1  3 X Average Risk  23.4   11.0        Use the calculated Patient Ratio above and  the CHD Risk Table to determine the patient's CHD Risk.        ATP III CLASSIFICATION (LDL):  <100     mg/dL   Optimal  161-096  mg/dL   Near or Above                    Optimal  130-159  mg/dL   Borderline  045-409  mg/dL   High  >811     mg/dL   Very High Performed at [facility], [address] [place], [place] [address]    Blood Alcohol level:  Lab Results  Component Value Date   ETH <10 09/12/2022   Metabolic Disorder Labs: Lab Results  Component Value Date   HGBA1C 4.5 (L) 09/15/2022   MPG 82.45 09/15/2022   No results found for: "PROLACTIN" Lab Results  Component Value Date   CHOL 184 09/15/2022   TRIG 104 09/15/2022   HDL 50 09/15/2022   CHOLHDL 3.7 09/15/2022   VLDL 21 09/15/2022   LDLCALC 113 (H) 09/15/2022   Physical Findings: AIMS: Facial and Oral Movements Muscles of Facial Expression: None, normal Lips and Perioral Area: None, normal Jaw: None, normal Tongue: None, normal,Extremity Movements Upper (arms, wrists, hands, fingers): None, normal Lower (legs, knees, ankles, toes): None, normal, Trunk Movements Neck, shoulders, hips: None, normal, Overall Severity Severity of abnormal movements (highest score from questions above): None, normal Incapacitation due to abnormal movements: None, normal Patient's awareness of abnormal movements (rate only patient's report): No Awareness, Dental Status Current problems with teeth and/or dentures?: No Does patient usually wear dentures?: No  CIWA:    COWS:     Musculoskeletal: Strength & Muscle Tone: within normal limits Gait & Station: normal Patient leans: N/A  Psychiatric Specialty Exam:  Presentation  General Appearance:  Casual  Eye Contact: Fair  Speech: Clear and Coherent  Speech Volume: Normal  Handedness: Right  Mood and Affect  Mood: Depressed  Affect: Appropriate  Thought Process  Thought Processes: Coherent; Goal Directed  Descriptions of  Associations:Intact  Orientation:Full (Time, Place and Person)  Thought Content:Logical  History of Schizophrenia/Schizoaffective disorder:No  Duration of Psychotic Symptoms:No data recorded Hallucinations:Hallucinations: None  Ideas of Reference:None  Suicidal Thoughts:Suicidal Thoughts: No  Homicidal Thoughts:Homicidal Thoughts: No  Sensorium  Memory: Immediate Fair; Recent Fair  Judgment: Fair  Insight: Fair  Art therapist  Concentration: Fair  Attention Span: Fair  Recall: [employer] of Knowledge: Fair  Language: Fair  Psychomotor Activity  Psychomotor Activity: Psychomotor Activity: Normal  Assets  Assets: Communication Skills; Desire for Improvement; Financial Resources/Insurance; Housing; Physical Health; Resilience; Social Support; Vocational/Educational  Sleep  Sleep: Sleep: Fair Number of Hours of Sleep: 5.75  Physical Exam: Physical Exam Vitals and nursing note reviewed.  Constitutional:      Appearance: He is normal weight. He is not ill-appearing.  HENT:     Head: Normocephalic.     Nose: Nose normal.  Mouth/Throat:     Mouth: Mucous membranes are moist.     Pharynx: Oropharynx is clear.  Eyes:     Pupils: Pupils are equal, round, and reactive to light.  Cardiovascular:     Rate and Rhythm: Normal rate.     Pulses: Normal pulses.  Pulmonary:     Effort: Pulmonary effort is normal.  Abdominal:     Palpations: Abdomen is soft.  Musculoskeletal:        General: Normal range of motion.     Cervical back: Normal range of motion.  Skin:    General: Skin is dry.  Neurological:     Mental Status: He is alert.  Psychiatric:        Attention and Perception: He does not perceive auditory or visual hallucinations.        Mood and Affect: Mood is depressed.        Speech: Speech normal.        Behavior: Behavior is cooperative.        Thought Content: Thought content is not paranoid or delusional. Thought content does  not include homicidal or suicidal ideation. Thought content does not include homicidal or suicidal plan.        Cognition and Memory: Cognition and memory normal.    Review of Systems  Psychiatric/Behavioral:  Positive for depression and substance abuse.   All other systems reviewed and are negative.  Blood pressure 107/62, pulse 82, temperature (!) 97.5 F (36.4 C), temperature source Oral, resp. rate 16, height 6\' 2"  (1.88 m), weight 81.6 kg, SpO2 100 %. Body mass index is 23.11 kg/m.  Treatment Plan Summary: Daily contact with patient to assess and evaluate symptoms and progress in treatment, Medication management, and Plan :  PLAN Safety and Monitoring: Voluntary admission to inpatient psychiatric unit for safety, stabilization and treatment Daily contact with patient to assess and evaluate symptoms and progress in treatment Patient's case to be discussed in multi-disciplinary team meeting Observation Level : q15 minute checks Vital signs: q12 hours Precautions: Safety   Long Term Goal(s): Improvement in symptoms so as ready for discharge   Short Term Goals: Ability to identify changes in lifestyle to reduce recurrence of condition will improve, Ability to disclose and discuss suicidal ideas, Ability to demonstrate self-control will improve, Ability to identify and develop effective coping behaviors will improve, Compliance with prescribed medications will improve, and Ability to identify triggers associated with substance abuse/mental health issues will improve   Principal Problem:   MDD (major depressive disorder), severe (HCC) Active Problems:   Alcohol use disorder   Stimulant use disorder   Insomnia   Delta-9-tetrahydrocannabinol (THC) dependence (HCC)   Attention deficit hyperactivity disorder (ADHD)   GAD (generalized anxiety disorder)   Social anxiety disorder  Medications:  MDD (major depressive disorder), severe (HCC) -Start:       -Wellbutrin XL 300 mg  daily -Continue:       -Sertraline taper: today 100 mg daily, 09/15/22 50 mg daily       -Venlafaxine XR 24 hr capsule 75 mg daily w/ breakfast -Discontinue:       -Quetiapine 100 mg due to drowsiness  Alcohol use disorder -Continue:       -Campral 666 mg TID w/ meals  Insomnia -Continue:      - Mirtazepine 15 mg daily HS      -Trazodone 50 mg nightly PRN insomnia  Attention deficit hyperactivity disorder (ADHD) -Start:       -Wellbutrin XL 300  mg daily   GAD (generalized anxiety disorder) Social anxiety disorder -Continue:       -Buspirone 20 mg q8hrs      -Propanolol 20 mg q8hrs      -Hydroxyzine 25 mg TID PRN for anxiety  Nicotine Use  -Decrease:   - Nicotine patch to 14 g daily; smoking cessation;  REMOVE AT NIGHT -Start:   - Nicorette Gum 2 mg prn; smoking cessation  -Continue Agitation protocol:       -(Zyprexa, Ativan, Geodon PRN)-See MAR   Other PRNS -Continue Tylenol 650 mg every 6 hours PRN for mild pain -Continue Maalox 30 mg every 4 hrs PRN for indigestion -Continue Milk of Magnesia as needed every 6 hrs for constipation   Discharge Planning: Social work and case management to assist with discharge planning and identification of hospital follow-up needs prior to discharge Estimated LOS: 5-7 days Discharge Concerns: Need to establish a safety plan; Medication compliance and effectiveness Discharge Goals: Return home with outpatient referrals for mental health follow-up including medication management/psychotherapy  Loletta Parish, NP 09/15/2022, 11:13 AM

## 2022-09-15 NOTE — Progress Notes (Signed)
   09/15/22 0545  Sleep  Number of Hours 5.75

## 2022-09-15 NOTE — Progress Notes (Signed)
Pt denied SI/HI/AVH this morning. Pt rated is depression a 6/10 and his anxiety a 6/10. Pt reports he slept about 6 hours last night. Pt stated that his goal for today is to attend more groups. Pt has been calm and cooperative throughout the day during interactions. Pt given scheduled medications as prescribed. Q15 min checks verified for safety. Pt is safe on the unit.    09/15/22 0913  Psych Admission Type (Psych Patients Only)  Admission Status Involuntary  Psychosocial Assessment  Patient Complaints Anxiety;Depression  Eye Contact Fair  Facial Expression Flat  Affect Anxious  Speech Logical/coherent  Interaction Assertive  Motor Activity Other (Comment) (WDL)  Appearance/Hygiene Unremarkable  Behavior Characteristics Appropriate to situation;Cooperative  Mood Anxious;Pleasant  Thought Process  Coherency WDL  Content WDL  Delusions None reported or observed  Perception WDL  Hallucination None reported or observed  Judgment Impaired  Confusion None  Danger to Self  Current suicidal ideation? Denies  Self-Injurious Behavior No self-injurious ideation or behavior indicators observed or expressed   Agreement Not to Harm Self Yes  Description of Agreement Verbal  Danger to Others  Danger to Others None reported or observed

## 2022-09-15 NOTE — [facility] Suicide Risk Assessment (Signed)
[place] INPATIENT:  Family/Significant Other Suicide Prevention Education  Suicide Prevention Education:  Education Completed; Francisco Yates [phone] (Mother) has been identified by the patient as the family member/significant other with whom the patient will be residing, and identified as the person(s) who will aid the patient in the event of a mental health crisis (suicidal ideations/suicide attempt).  With written consent from the patient, the family member/significant other has been provided the following suicide prevention education, prior to the and/or following the discharge of the patient.  The suicide prevention education provided includes the following: Suicide risk factors Suicide prevention and interventions National Suicide Hotline telephone number [facility] assessment telephone number [facility] Emergency Assistance [place] and/or Residential Mobile Crisis Unit telephone number  Request made of family/significant other to: Remove weapons (e.g., guns, rifles, knives), all items previously/currently identified as safety concern.   Remove drugs/medications (over-the-counter, prescriptions, illicit drugs), all items previously/currently identified as a safety concern.  The family member/significant other verbalizes understanding of the suicide prevention education information provided.  The family member/significant other agrees to remove the items of safety concern listed above.  CSW spoke with Francisco Yates who states that her son has been to many psychiatrists and therapists in the past.  She states that he has also been to several [facility]  She states that he often leaves the programs early and feels that "no treatment has helped this far".  She states that his current therapist and psychiatrist are "the best we have had so far".  She states that she would like her son to go to a residential treatment center after discharge.  She states that  she is concerned about him coming home and repeating the same behaviors with substance use and suicidal thoughts.  She states that "if he does refuse to go to a treatment center then he can come home to live with my or his father".  She states "he has a lot of support at home".  Francisco Yates states that there are no weapons or firearms in her home or her ex-husband's home.  She states that she will continue to encourage him to attend a residential treatment center.  CSW completed SPE with Francisco Yates.   Francisco Yates 09/15/2022, 3:02 PM

## 2022-09-16 LAB — MISC LABCORP TEST (SEND OUT): Labcorp test code: 81950

## 2022-09-16 MED ORDER — PROPRANOLOL HCL 10 MG PO TABS
10.0000 mg | ORAL_TABLET | Freq: Three times a day (TID) | ORAL | Status: DC
  Administered 2022-09-16 – 2022-09-18 (×5): 10 mg via ORAL
  Filled 2022-09-16 (×11): qty 1

## 2022-09-16 MED ORDER — VENLAFAXINE HCL ER 75 MG PO CP24
112.5000 mg | ORAL_CAPSULE | Freq: Every day | ORAL | Status: DC
  Filled 2022-09-16: qty 1

## 2022-09-16 MED ORDER — NICOTINE 21 MG/24HR TD PT24
21.0000 mg | MEDICATED_PATCH | Freq: Every day | TRANSDERMAL | Status: DC
  Administered 2022-09-17 – 2022-09-19 (×3): 21 mg via TRANSDERMAL
  Filled 2022-09-16 (×4): qty 1

## 2022-09-16 MED ORDER — MIRTAZAPINE 30 MG PO TBDP
30.0000 mg | ORAL_TABLET | Freq: Every day | ORAL | Status: DC
  Administered 2022-09-16 – 2022-09-18 (×3): 30 mg via ORAL
  Filled 2022-09-16 (×4): qty 1

## 2022-09-16 MED ORDER — VENLAFAXINE HCL ER 75 MG PO CP24
75.0000 mg | ORAL_CAPSULE | Freq: Every day | ORAL | Status: AC
  Administered 2022-09-17: 75 mg via ORAL
  Filled 2022-09-16: qty 1

## 2022-09-16 NOTE — Group Note (Signed)
Date:  09/16/2022 Time:  3:23 PM  Group Topic/Focus:  Emotional Education:   The focus of this group is to discuss what feelings/emotions are, and how they are experienced.    Participation Level:  Minimal  Participation Quality:  Monopolizing  Affect:  Resistant  Cognitive:  Delusional  Insight: Limited  Engagement in Group:  Monopolizing  Modes of Intervention:  Education  Additional Comments:  Educated patient on why acting aggressively would  not get his needs met- as it would only lead to personal consequences that would not contribute to his wellbeing.  D  09/16/2022, 3:23 PM  

## 2022-09-16 NOTE — Plan of Care (Signed)
Nurse discussed anxiety, depression and coping skills with patient.  

## 2022-09-16 NOTE — Plan of Care (Signed)
  Problem: Activity: Goal: Interest or engagement in activities will improve Outcome: Progressing   Problem: Health Behavior/Discharge Planning: Goal: Compliance with treatment plan for underlying cause of condition will improve Outcome: Progressing   Problem: Education: Goal: Knowledge of the prescribed therapeutic regimen will improve Outcome: Progressing   Problem: Education: Goal: Emotional status will improve Outcome: Not Progressing Goal: Mental status will improve Outcome: Not Progressing   Problem: Activity: Goal: Sleeping patterns will improve Outcome: Not Progressing

## 2022-09-16 NOTE — Progress Notes (Addendum)
[facility] MD Progress Note  09/16/2022 1:15 PM [name]  MRN:  [id number]  Reason For Admission: Francisco Yates is a 21 yo Caucasian male with prior mental health diagnoses of ADHD, MDD, GAD, polysubstance use  (ETOH, THC, amphetamines)  & social anxiety d/o who was IVC'd at [facility] for suicidal ideations with plan to overdose on Co2 or nitrous oxide. Pt was transported to the [facility] ER by [employer], and was subsequently transferred to this [facility] for treatment and stabilization of his mood.   Past 24 hours: Chart reviewed, pt's case discussed with his treatment team. V/S for the past 24 hrs have mostly been WNL. HR with an episode of bradycardia earlier today morning at 0645. HR was 45, but normalized after a recheck. Pt has been compliant with all medications in the past 24 hrs, and required the following PRN medications: Tylenol 650 mg yesterday morning, Hydroxyzine 25 mg last night and Trazodone 50 mg last night for insomnia. Pt slept for a total of 6.25 hrs last night as per nursing reports. He has not presented any behavioral issues, and has attended some unit group sessions in the past 24 hrs.  Today's patient assessment note: Pt presents with a depressed mood, but verbalizes an improvement in his mood as compared to at admission. Affect is congruent. His attention to personal hygiene and grooming is fair, eye contact is fair, speech is clear & coherent. Thought contents are organized and logical, and pt currently denies SI/HI/AVH or paranoia. There is no evidence of delusional thoughts.   Pt reports that his sleep quality last night was good, and reports that his appetite is good. Today, he rates anxiety as 6 (10 being worst), and rates depression as 5 (10 being worst). He denies being in any physical distress, and denies any concerns. He reports feeling good that his parents have both visited with him during this hospitalization.   Pt denies any side  effects to his current medications, and states that he is tolerating tapering off Zoloft and being on Effexor. He has successfully been tapered off Zoloft, and his first dose of Effexor 75 mg was given earlier today morning. We are continuing medications as listed below with no adjustments being made today.   Principal Problem: MDD (major depressive disorder), severe ([place]) Diagnosis: Principal Problem:   MDD (major depressive disorder), severe (HCC) Active Problems:   Alcohol use disorder   Stimulant use disorder   Insomnia   Delta-9-tetrahydrocannabinol (THC) dependence (HCC)   Attention deficit hyperactivity disorder (ADHD)   GAD (generalized anxiety disorder)   Social anxiety disorder  Total Time spent with patient: 30 minutes  Past Psychiatric History: see above  Past Medical History: History reviewed. No pertinent past medical history. History reviewed. No pertinent surgical history. Family History: History reviewed. No pertinent family history. Family Psychiatric History: mother- MDD, (m) aunt- bipolar, substance abuse, completed suicide (2007)  Social History:  Social History   Substance and Sexual Activity  Alcohol Use Yes   Comment: reports previosly he would drink 8-10 standard drinks 3-4x weekly, since previous inpatient rehab sting in June, alcohol use has decreased to 6 stand drinks 1x weekly     Social History   Substance and Sexual Activity  Drug Use Yes   Types: Marijuana   Comment: reports reduciton in [facility] (Delta[facility]) use from 3-4x weekly to 1x weekly since prior rehab stay in Jun of 2023    Social History   Socioeconomic History  Marital status: Single    Spouse name: Not on file   Number of children: Not on file   Years of education: Not on file   Highest education level: Not on file  Occupational History   Not on file  Tobacco Use   Smoking status: Every Day    Packs/day: 0.25    Types: Cigarettes   Smokeless tobacco: Never  Substance and Sexual  Activity   Alcohol use: Yes    Comment: reports previosly he would drink 8-10 standard drinks 3-4x weekly, since previous inpatient rehab sting in June, alcohol use has decreased to 6 stand drinks 1x weekly   Drug use: Yes    Types: Marijuana    Comment: reports reduciton in [facility] (Delta8) use from 3-4x weekly to 1x weekly since prior rehab stay in Jun of 2023   Sexual activity: Not Currently    Partners: Female  Other Topics Concern   Not on file  Social History Narrative   Not on file   Social Determinants of Health   Financial Resource Strain: Not on file  Food Insecurity: Unknown (09/13/2022)   Hunger Vital Sign    Worried About Running Out of Food in the Last Year: Patient refused    Ran Out of Food in the Last Year: Patient refused  Transportation Needs: Unknown (09/13/2022)   PRAPARE - Administrator, Civil Service (Medical): Patient refused    Lack of Transportation (Non-Medical): Patient refused  Physical Activity: Not on file  Stress: Not on file  Social Connections: Not on file   Additional Social History:    Sleep: Fair  Appetite:  Good  Current Medications: Current Facility-Administered Medications  Medication Dose Route Frequency Provider Last Rate Last Admin   acamprosate (CAMPRAL) tablet 666 mg  666 mg Oral TID WC Massengill, Harrold Donath, MD   666 mg at 09/16/22 1237   acetaminophen (TYLENOL) tablet 650 mg  650 mg Oral Q6H PRN Leevy-Johnson, Brooke A, NP   650 mg at 09/15/22 0629   alum & mag hydroxide-simeth (MAALOX/MYLANTA) 200-200-20 MG/5ML suspension 30 mL  30 mL Oral Q4H PRN Leevy-Johnson, Brooke A, NP       buPROPion (WELLBUTRIN XL) 24 hr tablet 300 mg  300 mg Oral Daily Massengill, Nathan, MD   300 mg at 09/16/22 0820   busPIRone (BUSPAR) tablet 20 mg  20 mg Oral Q8H Massengill, Harrold Donath, MD   20 mg at 09/16/22 0641   hydrOXYzine (ATARAX) tablet 25 mg  25 mg Oral TID PRN Phineas Inches, MD   25 mg at 09/15/22 2158   magnesium hydroxide (MILK OF  MAGNESIA) suspension 30 mL  30 mL Oral Daily PRN Leevy-Johnson, Brooke A, NP       mirtazapine (REMERON SOL-TAB) disintegrating tablet 15 mg  15 mg Oral QHS Massengill, Harrold Donath, MD   15 mg at 09/15/22 2118   [START ON 09/17/2022] nicotine (NICODERM CQ - dosed in mg/24 hours) patch 21 mg  21 mg Transdermal Daily Ashvin Adelson, NP       polyvinyl alcohol (LIQUIFILM TEARS) 1.4 % ophthalmic solution 1 drop  1 drop Both Eyes PRN Leevy-Johnson, Brooke A, NP       propranolol (INDERAL) tablet 20 mg  20 mg Oral Q8H Massengill, Nathan, MD   20 mg at 09/16/22 0640   traZODone (DESYREL) tablet 50 mg  50 mg Oral QHS PRN Phineas Inches, MD   50 mg at 09/15/22 2158   venlafaxine XR (EFFEXOR-XR) 24 hr capsule 75 mg  75 mg Oral Q breakfast Massengill, Harrold Donath, MD   75 mg at 09/16/22 0820   Lab Results:  Results for orders placed or performed during the hospital encounter of 09/13/22 (from the past 48 hour(s))  Comprehensive metabolic panel     Status: Abnormal   Collection Time: 09/15/22  6:40 AM  Result Value Ref Range   Sodium 139 135 - 145 mmol/L   Potassium 3.8 3.5 - 5.1 mmol/L   Chloride 106 98 - 111 mmol/L   CO2 29 22 - 32 mmol/L   Glucose, Bld 100 (H) 70 - 99 mg/dL    Comment: Glucose reference range applies only to samples taken after fasting for at least 8 hours.   BUN 14 6 - 20 mg/dL   Creatinine, Ser 6.56 0.61 - 1.24 mg/dL   Calcium 9.0 8.9 - 81.2 mg/dL   Total Protein 7.2 6.5 - 8.1 g/dL   Albumin 4.3 3.5 - 5.0 g/dL   AST 52 (H) 15 - 41 U/L   ALT 48 (H) 0 - 44 U/L   Alkaline Phosphatase 57 38 - 126 U/L   Total Bilirubin 0.6 0.3 - 1.2 mg/dL   GFR, Estimated >75 >17 mL/min    Comment: (NOTE) Calculated using the CKD-EPI Creatinine Equation (2021)    Anion gap 4 (L) 5 - 15    Comment: Performed at [facility], [address] [place], [place] [address]  Hemoglobin A1c     Status: Abnormal   Collection Time: 09/15/22  6:40 AM  Result Value Ref Range   Hgb A1c MFr  Bld 4.5 (L) 4.8 - 5.6 %    Comment: (NOTE) Pre diabetes:          5.7%-6.4%  Diabetes:              >6.4%  Glycemic control for   <7.0% adults with diabetes    Mean Plasma Glucose 82.45 mg/dL    Comment: Performed at [facility], [address] [place], [place] [address]  TSH     Status: None   Collection Time: 09/15/22  6:40 AM  Result Value Ref Range   TSH 1.211 0.350 - 4.500 uIU/mL    Comment: Performed by a 3rd Generation assay with a functional sensitivity of <=0.01 uIU/mL. Performed at [facility], [address], [place], [place] [address]   Lipid panel     Status: Abnormal   Collection Time: 09/15/22  6:40 AM  Result Value Ref Range   Cholesterol 184 0 - 200 mg/dL   Triglycerides 384 <665 mg/dL   HDL 50 >99 mg/dL   Total CHOL/HDL Ratio 3.7 RATIO   VLDL 21 0 - 40 mg/dL   LDL Cholesterol 357 (H) 0 - 99 mg/dL    Comment:        Total Cholesterol/HDL:CHD Risk Coronary Heart Disease Risk Table                     Men   Women  1/2 Average Risk   3.4   3.3  Average Risk       5.0   4.4  2 X Average Risk   9.6   7.1  3 X Average Risk  23.4   11.0        Use the calculated Patient Ratio above and the CHD Risk Table to determine the patient's CHD Risk.        ATP III CLASSIFICATION (LDL):  <100  mg/dL   Optimal  536-144  mg/dL   Near or Above                    Optimal  130-159  mg/dL   Borderline  315-400  mg/dL   High  >867     mg/dL   Very High Performed at [facility], [address] [place], [place] [address]   Urinalysis, Complete w Microscopic Urine, Clean Catch     Status: Abnormal   Collection Time: 09/15/22 12:04 PM  Result Value Ref Range   Color, Urine COLORLESS (A) YELLOW   APPearance CLEAR CLEAR   Specific Gravity, Urine 1.002 (L) 1.005 - 1.030   pH 8.0 5.0 - 8.0   Glucose, UA NEGATIVE NEGATIVE mg/dL   Hgb urine dipstick NEGATIVE NEGATIVE   Bilirubin Urine NEGATIVE NEGATIVE   Ketones, ur  NEGATIVE NEGATIVE mg/dL   Protein, ur NEGATIVE NEGATIVE mg/dL   Nitrite NEGATIVE NEGATIVE   Leukocytes,Ua NEGATIVE NEGATIVE   RBC / HPF 0-5 0 - 5 RBC/hpf   Bacteria, UA NONE SEEN NONE SEEN    Comment: Performed at [facility], [address] [place], [place] [address]   Blood Alcohol level:  Lab Results  Component Value Date   ETH <10 09/12/2022   Metabolic Disorder Labs: Lab Results  Component Value Date   HGBA1C 4.5 (L) 09/15/2022   MPG 82.45 09/15/2022   No results found for: "PROLACTIN" Lab Results  Component Value Date   CHOL 184 09/15/2022   TRIG 104 09/15/2022   HDL 50 09/15/2022   CHOLHDL 3.7 09/15/2022   VLDL 21 09/15/2022   LDLCALC 113 (H) 09/15/2022   Physical Findings: AIMS: Facial and Oral Movements Muscles of Facial Expression: None, normal Lips and Perioral Area: None, normal Jaw: None, normal Tongue: None, normal,Extremity Movements Upper (arms, wrists, hands, fingers): None, normal Lower (legs, knees, ankles, toes): None, normal, Trunk Movements Neck, shoulders, hips: None, normal, Overall Severity Severity of abnormal movements (highest score from questions above): None, normal Incapacitation due to abnormal movements: None, normal Patient's awareness of abnormal movements (rate only patient's report): No Awareness, Dental Status Current problems with teeth and/or dentures?: No Does patient usually wear dentures?: No  CIWA:  n/a COWS: n/a AIMS: n/a   Musculoskeletal: Strength & Muscle Tone: within normal limits Gait & Station: normal Patient leans: N/A  Psychiatric Specialty Exam:  Presentation  General Appearance:  Appropriate for Environment; Fairly Groomed  Eye Contact: Fair  Speech: Clear and Coherent  Speech Volume: Normal  Handedness: Right  Mood and Affect  Mood: Depressed  Affect: Congruent  Thought Process  Thought Processes: Coherent  Descriptions of  Associations:Intact  Orientation:Full (Time, Place and Person)  Thought Content:Logical  History of Schizophrenia/Schizoaffective disorder:No  Duration of Psychotic Symptoms:No data recorded Hallucinations:Hallucinations: None  Ideas of Reference:None  Suicidal Thoughts:Suicidal Thoughts: No  Homicidal Thoughts:Homicidal Thoughts: No  Sensorium  Memory: Immediate Good  Judgment: Fair  Insight: Fair  Chartered certified accountant: Fair  Attention Span: Fair  Recall: Good  Fund of Knowledge: Good  Language: Good  Psychomotor Activity  Psychomotor Activity: Psychomotor Activity: Normal  Assets  Assets: Communication Skills; Housing; Social Support  Sleep  Sleep: Sleep: Good Number of Hours of Sleep: 5.75  Physical Exam: Physical Exam Vitals and nursing note reviewed.  Constitutional:      Appearance: He is normal weight. He is not ill-appearing.  HENT:     Head: Normocephalic.  Nose: Nose normal.     Mouth/Throat:     Mouth: Mucous membranes are moist.     Pharynx: Oropharynx is clear.  Eyes:     Pupils: Pupils are equal, round, and reactive to light.  Cardiovascular:     Rate and Rhythm: Normal rate.     Pulses: Normal pulses.  Pulmonary:     Effort: Pulmonary effort is normal.  Abdominal:     Palpations: Abdomen is soft.  Musculoskeletal:        General: Normal range of motion.     Cervical back: Normal range of motion.  Skin:    General: Skin is dry.  Neurological:     Mental Status: He is alert.  Psychiatric:        Attention and Perception: He does not perceive auditory or visual hallucinations.        Mood and Affect: Mood is depressed.        Speech: Speech normal.        Behavior: Behavior is cooperative.        Thought Content: Thought content is not paranoid or delusional. Thought content does not include homicidal or suicidal ideation. Thought content does not include homicidal or suicidal plan.        Cognition  and Memory: Cognition and memory normal.    Review of Systems  Constitutional:  Negative for fever.  HENT:  Negative for hearing loss.   Cardiovascular: Negative.  Negative for chest pain.  Gastrointestinal:  Negative for heartburn.  Genitourinary: Negative.   Musculoskeletal:  Negative for myalgias.  Neurological:  Negative for dizziness.  Psychiatric/Behavioral:  Positive for depression (improving), hallucinations and substance abuse. Negative for memory loss and suicidal ideas. The patient is nervous/anxious (improving) and has insomnia (resolving).   All other systems reviewed and are negative.  Blood pressure 103/62, pulse 61, temperature (!) 97.5 F (36.4 C), temperature source Oral, resp. rate 16, height 6\' 2"  (1.88 m), weight 81.6 kg, SpO2 100 %. Body mass index is 23.11 kg/m.  Treatment Plan Summary: Daily contact with patient to assess and evaluate symptoms and progress in treatment, Medication management, and Plan :  PLAN Safety and Monitoring: Involuntary admission to inpatient psychiatric unit for safety, stabilization and treatment Daily contact with patient to assess and evaluate symptoms and progress in treatment Patient's case to be discussed in multi-disciplinary team meeting Observation Level : q15 minute checks Vital signs: q12 hours Precautions: Safety   Long Term Goal(s): Improvement in symptoms so as ready for discharge   Short Term Goals: Ability to identify changes in lifestyle to reduce recurrence of condition will improve, Ability to disclose and discuss suicidal ideas, Ability to demonstrate self-control will improve, Ability to identify and develop effective coping behaviors will improve, Compliance with prescribed medications will improve, and Ability to identify triggers associated with substance abuse/mental health issues will improve   Principal Problem:   MDD (major depressive disorder), severe (HCC) Active Problems:   Alcohol use disorder    Stimulant use disorder   Insomnia   Delta-9-tetrahydrocannabinol (THC) dependence (HCC)   Attention deficit hyperactivity disorder (ADHD)   GAD (generalized anxiety disorder)   Social anxiety disorder  Medications:  MDD (major depressive disorder), severe (HCC) -Continue Wellbutrin XL 300 mg daily -Previously discontinued Zoloft (home med)-Completely tapered off on 10/20 @ 0800 -Continue Venlafaxine XR 24 hr capsule 75 mg daily w/ breakfast -Previously discontinued Quetiapine 100 mg due to drowsiness  Alcohol use disorder -Continue Campral 666 mg TID w/ meals  Insomnia -Continue Mirtazapine 15 mg daily HS -Continue Trazodone 50 mg nightly PRN insomnia  Attention deficit hyperactivity disorder (ADHD) -Continue Wellbutrin XL 300 mg daily   GAD (generalized anxiety disorder) Social anxiety disorder -Continue Buspirone 20 mg q8hrs -Continue Propanolol 20 mg q8hrs -Continue Hydroxyzine 25 mg TID PRN for anxiety  Nicotine Use  -Increase Nicotine patch to 21 mg daily; smoking cessation;  -Discontinue nicorette Gum 2 mg prn; smoking cessation (can only have one (gum or patch)-Pt chose patch on admission  -Continue Agitation protocol:       -(Zyprexa, Ativan, Geodon PRN)-See MAR   Other PRNS -Continue Tylenol 650 mg every 6 hours PRN for mild pain -Continue Maalox 30 mg every 4 hrs PRN for indigestion -Continue Milk of Magnesia as needed every 6 hrs for constipation   Discharge Planning: Social work and case management to assist with discharge planning and identification of hospital follow-up needs prior to discharge Estimated LOS: 5-7 days Discharge Concerns: Need to establish a safety plan; Medication compliance and effectiveness Discharge Goals: Return home with outpatient referrals for mental health follow-up including medication management/psychotherapy  Starleen Blueoris  Jatia Musa, NP 09/16/2022, 1:15 PMPatient ID: Francisco ReichmannJack Vultaggio, male   DOB: 05-15-2001, 21 y.o.   MRN: [id number]

## 2022-09-16 NOTE — Group Note (Signed)
[facility] Francisco Yates Group Therapy Note   Group Date: 09/16/2022 Start Time: 1300 End Time: 1400   Type of Therapy and Topic: Group Therapy: Avoiding Self-Sabotaging and Enabling Behaviors  Participation Level: Active  Mood:  Description of Group:  In this group, patients will learn how to identify obstacles, self-sabotaging and enabling behaviors, as well as: what are they, why do we do them and what needs these behaviors meet. Discuss unhealthy relationships and how to have positive healthy boundaries with those that sabotage and enable. Explore aspects of self-sabotage and enabling in yourself and how to limit these self-destructive behaviors in everyday life.   Therapeutic Goals: 1. Patient will identify one obstacle that relates to self-sabotage and enabling behaviors 2. Patient will identify one personal self-sabotaging or enabling behavior they did prior to admission 3. Patient will state a plan to change the above identified behavior 4. Patient will demonstrate ability to communicate their needs through discussion and/or role play.    Summary of Patient Progress:  Therapeutic Modalities:  Cognitive Behavioral Therapy Person-Centered Therapy Motivational Interviewing    Francisco Yates S [name], Francisco Yates 

## 2022-09-16 NOTE — Group Note (Signed)
Date:  09/16/2022 Time:  2:24 PM  Group Topic/Focus:  Orientation:   The focus of this group is to educate the patient on the purpose and policies of crisis stabilization and provide a format to answer questions about their admission.  The group details unit policies and expectations of patients while admitted.    Participation Level:  Did Not Attend  Participation Quality:      Affect:      Cognitive:      Insight: None  Engagement in Group:  Defensive and    Modes of Intervention:      Additional Comments:     Jerrye Beavers 09/16/2022, 2:24 PM

## 2022-09-16 NOTE — Progress Notes (Signed)
   09/16/22 0553  Sleep  Number of Hours 6.25

## 2022-09-16 NOTE — Progress Notes (Signed)
Adult Psychoeducational Group Note  Date:  09/16/2022 Time:  9:24 PM  Group Topic/Focus:  Wrap-Up Group:   The focus of this group is to help patients review their daily goal of treatment and discuss progress on daily workbooks.  Participation Level:  Active  Participation Quality:  Appropriate  Affect:  Appropriate  Cognitive:  Appropriate  Insight: Appropriate  Engagement in Group:  Engaged  Modes of Intervention:  Education and Exploration  Additional Comments:  Patient attended and participated in group tonight. He reports that while he has been here he learnt about a new depression treatment TMS  Francisco Yates 09/16/2022, 9:24 PM

## 2022-09-16 NOTE — Progress Notes (Signed)
D- Patient alert and oriented. Affect/mood. Denies SI, HI, AVH, and pain. Patient reports anxiety of 6/10 and depression of 7/10. Patient was observed in the dayroom and in group.   A- Scheduled medications administered to patient, per MD orders. PRN trazadone and hydroxyzine given per patient request. Support and encouragement provided.  Routine safety checks conducted every 15 minutes.  Patient informed to notify staff with problems or concerns.  R- No adverse drug reactions noted. Patient contracts for safety at this time. Patient compliant with medications and treatment plan. Patient receptive, calm, and cooperative. Patient interacts well with others on the unit.  Patient remains safe at this time.

## 2022-09-16 NOTE — Group Note (Unsigned)
Date:  09/16/2022 Time:  2:23 PM  Group Topic/Focus:  Orientation:   The focus of this group is to educate the patient on the purpose and policies of crisis stabilization and provide a format to answer questions about their admission.  The group details unit policies and expectations of patients while admitted.     Participation Level:  {[facility] PARTICIPATION IFBPP:94327}  Participation Quality:  {[facility] PARTICIPATION QUALITY:22265}  Affect:  {[facility] AFFECT:22266}  Cognitive:  {[facility] COGNITIVE:22267}  Insight: {[facility] Insight2:20797}  Engagement in Group:  {[facility] ENGAGEMENT IN MDYJW:92957}  Modes of Intervention:  {[facility] MODES OF INTERVENTION:22269}  Additional Comments:  ***  Jerrye Beavers 09/16/2022, 2:23 PM

## 2022-09-16 NOTE — Group Note (Signed)
[facility] LCSW Group Therapy Note    Group Date: 09/16/2022 Start Time: 1300 End Time: 1400  Type of Therapy and Topic:  Group Therapy:  Overcoming Obstacles  Participation Level:  [facility] PARTICIPATION LEVEL: Minimal  Mood: Euthymic   Description of Group:   In this group patients will be encouraged to explore what they see as obstacles to their own wellness and recovery. They will be guided to discuss their thoughts, feelings, and behaviors related to these obstacles. The group will process together ways to cope with barriers, with attention given to specific choices patients can make. Each patient will be challenged to identify changes they are motivated to make in order to overcome their obstacles. This group will be process-oriented, with patients participating in exploration of their own experiences as well as giving and receiving support and challenge from other group members.  Therapeutic Goals: 1. Patient will identify personal and current obstacles as they relate to admission. 2. Patient will identify barriers that currently interfere with their wellness or overcoming obstacles.  3. Patient will identify feelings, thought process and behaviors related to these barriers. 4. Patient will identify two changes they are willing to make to overcome these obstacles:    Summary of Patient Progress Patient was present for the entirety of group session. Patient participated in opening and closing remarks. However, patient did not contribute at all to the topic of discussion despite encouraged participation.    Therapeutic Modalities:   Cognitive Behavioral Therapy Solution Focused Therapy Motivational Interviewing Relapse Prevention Therapy   Durenda Hurt, [place]

## 2022-09-16 NOTE — Progress Notes (Signed)
D:  Patient's self inventory sheet, patient sleeps good, sleep medication helpful.  Good appetite, normal energy level, good concentration.  Rated depression 5, hopeless 8, anxiety 4.  Denied withdrawals.  Denied SI.  Denied physical problems.  Denied physical pain.  Goal is prevent relapse.  Does have discharge plans. A:  Medications administered per MD orders.  Emotional support and encouragement given patient.   R:  Denied SI and HI, contracts for safety.  Denied A/V hallucinations.  Safety maintained with 15 minute checks.  Patient stated he slept 7 hours last night.

## 2022-09-17 MED ORDER — VENLAFAXINE HCL ER 150 MG PO CP24
150.0000 mg | ORAL_CAPSULE | Freq: Every day | ORAL | Status: DC
  Administered 2022-09-18 – 2022-09-19 (×2): 150 mg via ORAL
  Filled 2022-09-17 (×3): qty 1

## 2022-09-17 NOTE — [facility] Group Notes (Signed)
Adult Goals Group Note  Date:  09/17/2022 Time:  11:55 AM  Group Topic/Focus:  Goals Group:   The focus of this group is to help patients establish daily goals to achieve during treatment and discuss how the patient can incorporate goal setting into their daily lives to aide in recovery.  Participation Level:  Did Not Attend 

## 2022-09-17 NOTE — [facility] Group Notes (Signed)
Did not attend group 

## 2022-09-17 NOTE — Progress Notes (Signed)
Adult Psychoeducational Group Note  Date:  09/17/2022 Time:  8:45 PM  Group Topic/Focus:  Wrap-Up Group:   The focus of this group is to help patients review their daily goal of treatment and discuss progress on daily workbooks.  Participation Level:  Active  Participation Quality:  Appropriate  Affect:  Appropriate  Cognitive:  Appropriate  Insight: Appropriate  Engagement in Group:  Engaged  Modes of Intervention:  Education and Exploration  Additional Comments:  Patient attended and participated in group tonight. He reports that the most significant thing that happen for him today was he found out that he will be discharged on Monday. The thing he like about himself is that he is genuine.  Salley Scarlet [facility] 09/17/2022, 8:45 PM

## 2022-09-17 NOTE — Progress Notes (Addendum)
[facility] MD Progress Note  09/17/2022 11:37 AM Francisco Yates  MRN:  [id number]  Reason For Admission: Francisco Yates is a 21 yo Caucasian male with prior mental health diagnoses of ADHD, MDD, GAD, polysubstance use  (ETOH, THC, amphetamines)  & social anxiety d/o who was IVC'd at [facility] for suicidal ideations with plan to overdose on Co2 or nitrous oxide. Pt was transported to the [facility] ER by [employer], and was subsequently transferred to this [facility] for treatment and stabilization of his mood.   Past 24 hours: Chart reviewed, pt's case discussed with his treatment team. V/S for the past 24 hrs have continued to be mostly WNL. Pt continues to compliant with all medications in the past 24 hrs, and required the following PRN medications: Tylenol 650 mg yesterday morning, Hydroxyzine 25 mg last night and Trazodone 50 mg last night for insomnia. Pt slept for a total of 7.25 hrs last night as per nursing reports. He has not presented any behavioral issues, and has attended some unit group sessions in the past 24 hrs.  Today's patient assessment note: Pt reports that there is continuing to be an improvement of his mood day-by-day since admission.  Mood is depressed, but patient smiles intermittently throughout the assessment.  He denies suicidal ideations, denies homicidal ideations, denies auditory or visual hallucinations.  Patient denies paranoia and there is no evidence of delusional thinking.  Patient reports a good sleep quality last night and also reports a good appetite.  He denies being in any physical distress and denies any medication related side effects.  He reports that his anxiety is diminishing on current medications.  The cross taper from Zoloft to Effexor is now completed and we are increasing Effexor from 75 mg to 150 mg starting tomorrow 10/22 for management of depressive symptoms.  We will continue Remeron 30 mg nightly to help with appetite  stimulation, depressive symptoms, and insomnia.  We will continue other medications as listed below.  Patient has been educated that his projected discharge date is Monday 10/23, and is agreeable to this plan.  He denies any concerns and states that his parents will pick him up at discharge.  Patient reports that he wants to keep his outpatient mental health providers and does not want any appointments made for him.  We will continue to follow.   Principal Problem: MDD (major depressive disorder), severe (HCC) Diagnosis: Principal Problem:   MDD (major depressive disorder), severe (HCC) Active Problems:   Alcohol use disorder   Stimulant use disorder   Insomnia   Delta-9-tetrahydrocannabinol (THC) dependence (HCC)   Attention deficit hyperactivity disorder (ADHD)   GAD (generalized anxiety disorder)   Social anxiety disorder  Total Time spent with patient: 30 minutes  Past Psychiatric History: see above  Past Medical History: History reviewed. No pertinent past medical history. History reviewed. No pertinent surgical history. Family History: History reviewed. No pertinent family history. Family Psychiatric History: mother- MDD, (m) aunt- bipolar, substance abuse, completed suicide (2007)  Social History:  Social History   Substance and Sexual Activity  Alcohol Use Yes   Comment: reports previosly he would drink 8-10 standard drinks 3-4x weekly, since previous inpatient rehab sting in June, alcohol use has decreased to 6 stand drinks 1x weekly     Social History   Substance and Sexual Activity  Drug Use Yes   Types: Marijuana   Comment: reports reduciton in THC (Delta8) use from 3-4x weekly to 1x weekly since  prior rehab stay in Jun of 2023    Social History   Socioeconomic History   Marital status: Single    Spouse name: Not on file   Number of children: Not on file   Years of education: Not on file   Highest education level: Not on file  Occupational History   Not on  file  Tobacco Use   Smoking status: Every Day    Packs/day: 0.25    Types: Cigarettes   Smokeless tobacco: Never  Substance and Sexual Activity   Alcohol use: Yes    Comment: reports previosly he would drink 8-10 standard drinks 3-4x weekly, since previous inpatient rehab sting in June, alcohol use has decreased to 6 stand drinks 1x weekly   Drug use: Yes    Types: Marijuana    Comment: reports reduciton in [facility] (Delta8) use from 3-4x weekly to 1x weekly since prior rehab stay in Jun of 2023   Sexual activity: Not Currently    Partners: Female  Other Topics Concern   Not on file  Social History Narrative   Not on file   Social Determinants of Health   Financial Resource Strain: Not on file  Food Insecurity: Unknown (09/13/2022)   Hunger Vital Sign    Worried About Running Out of Food in the Last Year: Patient refused    Ran Out of Food in the Last Year: Patient refused  Transportation Needs: Unknown (09/13/2022)   PRAPARE - Administrator, Civil Service (Medical): Patient refused    Lack of Transportation (Non-Medical): Patient refused  Physical Activity: Not on file  Stress: Not on file  Social Connections: Not on file   Additional Social History:    Sleep: Fair  Appetite:  Good  Current Medications: Current Facility-Administered Medications  Medication Dose Route Frequency Provider Last Rate Last Admin   acamprosate (CAMPRAL) tablet 666 mg  666 mg Oral TID WC Massengill, Harrold Donath, MD   666 mg at 09/17/22 0617   acetaminophen (TYLENOL) tablet 650 mg  650 mg Oral Q6H PRN Leevy-Johnson, Brooke A, NP   650 mg at 09/15/22 0629   alum & mag hydroxide-simeth (MAALOX/MYLANTA) 200-200-20 MG/5ML suspension 30 mL  30 mL Oral Q4H PRN Leevy-Johnson, Brooke A, NP       buPROPion (WELLBUTRIN XL) 24 hr tablet 300 mg  300 mg Oral Daily Massengill, Nathan, MD   300 mg at 09/17/22 0824   busPIRone (BUSPAR) tablet 20 mg  20 mg Oral Q8H Massengill, Nathan, MD   20 mg at 09/17/22  0617   hydrOXYzine (ATARAX) tablet 25 mg  25 mg Oral TID PRN Phineas Inches, MD   25 mg at 09/16/22 2211   magnesium hydroxide (MILK OF MAGNESIA) suspension 30 mL  30 mL Oral Daily PRN Leevy-Johnson, Brooke A, NP       mirtazapine (REMERON SOL-TAB) disintegrating tablet 30 mg  30 mg Oral QHS Massengill, Nathan, MD   30 mg at 09/16/22 2137   nicotine (NICODERM CQ - dosed in mg/24 hours) patch 21 mg  21 mg Transdermal Daily Berlin Viereck, NP   21 mg at 09/17/22 0825   polyvinyl alcohol (LIQUIFILM TEARS) 1.4 % ophthalmic solution 1 drop  1 drop Both Eyes PRN Leevy-Johnson, Brooke A, NP       propranolol (INDERAL) tablet 10 mg  10 mg Oral Q8H Massengill, Nathan, MD   10 mg at 09/17/22 0617   traZODone (DESYREL) tablet 50 mg  50 mg Oral QHS PRN Massengill, Harrold Donath,  MD   50 mg at 09/16/22 2211   [START ON 09/18/2022] venlafaxine XR (EFFEXOR-XR) 24 hr capsule 150 mg  150 mg Oral Q breakfast Starleen BlueNkwenti, Elma Shands, NP       Lab Results:  Results for orders placed or performed during the hospital encounter of 09/13/22 (from the past 48 hour(s))  Urinalysis, Complete w Microscopic Urine, Clean Catch     Status: Abnormal   Collection Time: 09/15/22 12:04 PM  Result Value Ref Range   Color, Urine COLORLESS (A) YELLOW   APPearance CLEAR CLEAR   Specific Gravity, Urine 1.002 (L) 1.005 - 1.030   pH 8.0 5.0 - 8.0   Glucose, UA NEGATIVE NEGATIVE mg/dL   Hgb urine dipstick NEGATIVE NEGATIVE   Bilirubin Urine NEGATIVE NEGATIVE   Ketones, ur NEGATIVE NEGATIVE mg/dL   Protein, ur NEGATIVE NEGATIVE mg/dL   Nitrite NEGATIVE NEGATIVE   Leukocytes,Ua NEGATIVE NEGATIVE   RBC / HPF 0-5 0 - 5 RBC/hpf   Bacteria, UA NONE SEEN NONE SEEN    Comment: Performed at [facility], [address] [place], [place] [id number]   Blood Alcohol level:  Lab Results  Component Value Date   ETH <10 09/12/2022   Metabolic Disorder Labs: Lab Results  Component Value Date   HGBA1C 4.5 (L) 09/15/2022   MPG  82.45 09/15/2022   No results found for: "PROLACTIN" Lab Results  Component Value Date   CHOL 184 09/15/2022   TRIG 104 09/15/2022   HDL 50 09/15/2022   CHOLHDL 3.7 09/15/2022   VLDL 21 09/15/2022   LDLCALC 113 (H) 09/15/2022   Physical Findings: AIMS: Facial and Oral Movements Muscles of Facial Expression: None, normal Lips and Perioral Area: None, normal Jaw: None, normal Tongue: None, normal,Extremity Movements Upper (arms, wrists, hands, fingers): None, normal Lower (legs, knees, ankles, toes): None, normal, Trunk Movements Neck, shoulders, hips: None, normal, Overall Severity Severity of abnormal movements (highest score from questions above): None, normal Incapacitation due to abnormal movements: None, normal Patient's awareness of abnormal movements (rate only patient's report): No Awareness, Dental Status Current problems with teeth and/or dentures?: No Does patient usually wear dentures?: No  CIWA:  n/a COWS: n/a AIMS: n/a   Musculoskeletal: Strength & Muscle Tone: within normal limits Gait & Station: normal Patient leans: N/A  Psychiatric Specialty Exam:  Presentation  General Appearance:  Appropriate for Environment; Fairly Groomed  Eye Contact: Fair  Speech: Clear and Coherent  Speech Volume: Normal  Handedness: Right  Mood and Affect  Mood: Depressed  Affect: Congruent  Thought Process  Thought Processes: Coherent  Descriptions of Associations:Intact  Orientation:Full (Time, Place and Person)  Thought Content:Logical  History of Schizophrenia/Schizoaffective disorder:No  Duration of Psychotic Symptoms:No data recorded Hallucinations:Hallucinations: None  Ideas of Reference:None  Suicidal Thoughts:Suicidal Thoughts: No  Homicidal Thoughts:Homicidal Thoughts: No  Sensorium  Memory: Immediate Good  Judgment: Fair  Insight: Fair  Art therapistxecutive Functions  Concentration: Good  Attention  Span: Good  Recall: Good  Fund of Knowledge: Good  Language: Good  Psychomotor Activity  Psychomotor Activity: Psychomotor Activity: Normal  Assets  Assets: Communication Skills; Housing; Social Support  Sleep  Sleep: Sleep: Good  Physical Exam: Physical Exam Vitals and nursing note reviewed.  Constitutional:      Appearance: He is normal weight. He is not ill-appearing.  HENT:     Head: Normocephalic.     Nose: Nose normal.     Mouth/Throat:     Mouth: Mucous membranes are moist.  Pharynx: Oropharynx is clear.  Eyes:     Pupils: Pupils are equal, round, and reactive to light.  Cardiovascular:     Rate and Rhythm: Normal rate.     Pulses: Normal pulses.  Pulmonary:     Effort: Pulmonary effort is normal.  Abdominal:     Palpations: Abdomen is soft.  Musculoskeletal:        General: Normal range of motion.     Cervical back: Normal range of motion.  Skin:    General: Skin is dry.  Neurological:     Mental Status: He is alert.  Psychiatric:        Attention and Perception: He does not perceive auditory or visual hallucinations.        Mood and Affect: Mood is depressed.        Speech: Speech normal.        Behavior: Behavior is cooperative.        Thought Content: Thought content is not paranoid or delusional. Thought content does not include homicidal or suicidal ideation. Thought content does not include homicidal or suicidal plan.        Cognition and Memory: Cognition and memory normal.    Review of Systems  Constitutional:  Negative for fever.  HENT:  Negative for hearing loss.   Cardiovascular: Negative.  Negative for chest pain.  Gastrointestinal:  Negative for heartburn.  Genitourinary: Negative.   Musculoskeletal:  Negative for myalgias.  Neurological:  Negative for dizziness.  Psychiatric/Behavioral:  Positive for depression (improving) and substance abuse. Negative for hallucinations, memory loss and suicidal ideas. The patient is  nervous/anxious (improving) and has insomnia (resolving).   All other systems reviewed and are negative.  Blood pressure 111/60, pulse 77, temperature (!) 97.5 F (36.4 C), temperature source Oral, resp. rate 16, height 6\' 2"  (1.88 m), weight 81.6 kg, SpO2 98 %. Body mass index is 23.11 kg/m.  Treatment Plan Summary: Daily contact with patient to assess and evaluate symptoms and progress in treatment, Medication management, and Plan :  PLAN Safety and Monitoring: Involuntary admission to inpatient psychiatric unit for safety, stabilization and treatment Daily contact with patient to assess and evaluate symptoms and progress in treatment Patient's case to be discussed in multi-disciplinary team meeting Observation Level : q15 minute checks Vital signs: q12 hours Precautions: Safety   Long Term Goal(s): Improvement in symptoms so as ready for discharge   Short Term Goals: Ability to identify changes in lifestyle to reduce recurrence of condition will improve, Ability to disclose and discuss suicidal ideas, Ability to demonstrate self-control will improve, Ability to identify and develop effective coping behaviors will improve, Compliance with prescribed medications will improve, and Ability to identify triggers associated with substance abuse/mental health issues will improve   Principal Problem:   MDD (major depressive disorder), severe (HCC) Active Problems:   Alcohol use disorder   Stimulant use disorder   Insomnia   Delta-9-tetrahydrocannabinol (THC) dependence (HCC)   Attention deficit hyperactivity disorder (ADHD)   GAD (generalized anxiety disorder)   Social anxiety disorder  Medications:  MDD (major depressive disorder), severe (HCC) -Continue Wellbutrin XL 300 mg daily -Previously discontinued Zoloft (home med)-Completely tapered off on 10/20 @ 0800 -Increase Venlafaxine XR 24 hr capsule to 150 mg daily starting 10/22 -Previously discontinued Quetiapine 100 mg due to  drowsiness  Alcohol use disorder -Continue Campral 666 mg TID w/ meals  Insomnia -Continue Mirtazapine 30 mg daily HS -Continue Trazodone 50 mg nightly PRN insomnia  Attention deficit hyperactivity disorder (  ADHD) -Continue Wellbutrin XL 300 mg daily   GAD (generalized anxiety disorder) Social anxiety disorder -Continue Buspirone 20 mg q8hrs -Continue Propanolol 20 mg q8hrs -Continue Hydroxyzine 25 mg TID PRN for anxiety  Nicotine Use  -Continue Nicotine patch to 21 mg daily; smoking cessation;  -Previously discontinue nicorette Gum 2 mg prn; smoking cessation (can only have one (gum or patch)-Pt chose patch on admission  -Continue Agitation protocol:       -(Zyprexa, Ativan, Geodon PRN)-See MAR   Other PRNS -Continue Tylenol 650 mg every 6 hours PRN for mild pain -Continue Maalox 30 mg every 4 hrs PRN for indigestion -Continue Milk of Magnesia as needed every 6 hrs for constipation   Discharge Planning: Social work and case management to assist with discharge planning and identification of hospital follow-up needs prior to discharge Estimated LOS: 5-7 days Discharge Concerns: Need to establish a safety plan; Medication compliance and effectiveness Discharge Goals: Return home with outpatient referrals for mental health follow-up including medication management/psychotherapy  Nicholes Rough, NP 09/17/2022, 11:37 AMPatient ID: Francisco Yates, male   DOB: 10-29-2001, 21 y.o.   MRN: [id number] Patient ID: Francisco Yates, male   DOB: [birth year], 21 y.o.   MRN: [id number]

## 2022-09-17 NOTE — Progress Notes (Signed)
   09/17/22 0530  Sleep  Number of Hours 7.25

## 2022-09-17 NOTE — Group Note (Signed)
[facility] LCSW Group Therapy Note  09/17/2022  10:00-11:00AM  Type of Therapy and Topic:  Group Therapy:  Grief   Participation Level:  Did Not Attend   Description of Group:  Patients in this group asked to talk about grief during this session.  As the topic was introduced, we talked about grief involving death much of the time, but also that it is the emotion we may experience at the loss of a relationship, job, pet, etc.  Patients were introduced to the well-known Kubler-Ross concept of 5 stages of grief (Denial, Anger, Bargaining, Depression, Acceptance) and the fact that these stages are often not linear and people may go through one or more stages numerous times, although with greater ease each subsequent time.  This led to a discussion about patients' sources of grief and what their experiences have been in going through this process.  Therapeutic Goals: 1)  learn about the 5 stages of grief 2)  share patients' direct experiences in going through these stages  3)  give an opportunity to each patient to say out loud what they would like to say to a person they are currently grieving  4) discuss actions that could possibly help in the grief process such as writing a letter, journaling, listening to music, exercising, saying names out loud, and more   Summary of Patient Progress:  Patient was invited to group, did not attend.   Therapeutic Modalities:   Motivational Interviewing Processing  Bron Snellings J Grossman-Orr      

## 2022-09-18 NOTE — Progress Notes (Signed)
   09/18/22 2200  Psych Admission Type (Psych Patients Only)  Admission Status Voluntary  Psychosocial Assessment  Patient Complaints None  Eye Contact Avertive  Facial Expression Flat  Affect Blunted  Speech Logical/coherent;Soft  Interaction Forwards little;Guarded  Motor Activity Other (Comment) (WDL)  Appearance/Hygiene Improved  Behavior Characteristics Cooperative;Calm  Mood Depressed  Thought Process  Coherency WDL  Content WDL  Delusions None reported or observed  Perception WDL  Hallucination None reported or observed  Judgment WDL  Confusion None  Danger to Self  Current suicidal ideation? Denies  Self-Injurious Behavior No self-injurious ideation or behavior indicators observed or expressed   Agreement Not to Harm Self Yes  Description of Agreement verbal  Danger to Others  Danger to Others None reported or observed

## 2022-09-18 NOTE — Progress Notes (Signed)
Pt presents with depressed mood,affect congruent. Francisco Yates reports he is '' getting better and feel okay today ''  He states he is sleeping and eating well and overall feels '' improved''  He denies any SI or HI or A.V Hallucinations and no signs of acute decompensation.  Pt has not yet completed self inventory despite being asked to do but has been compliant with medications. He has not been attending programming this am but denies any acute concerns.  Pt able to make his needs known. Eating and drinking well. Will con't to monitor.

## 2022-09-18 NOTE — Progress Notes (Signed)
   09/18/22 0030  Psych Admission Type (Psych Patients Only)  Admission Status Involuntary  Psychosocial Assessment  Patient Complaints Depression;Isolation  Eye Contact Fair  Facial Expression Flat  Affect Flat  Speech Logical/coherent  Interaction Guarded  Motor Activity Restless  Appearance/Hygiene Disheveled  Behavior Characteristics Cooperative;Calm  Mood Depressed  Thought Process  Coherency WDL  Content WDL  Delusions None reported or observed  Perception WDL  Hallucination None reported or observed  Judgment WDL  Confusion None  Danger to Self  Current suicidal ideation? Denies  Self-Injurious Behavior No self-injurious ideation or behavior indicators observed or expressed   Agreement Not to Harm Self Yes  Description of Agreement verbal  Danger to Others  Danger to Others None reported or observed

## 2022-09-18 NOTE — Progress Notes (Signed)
Adult Psychoeducational Group Note  Date:  09/18/2022 Time:  9:00 PM  Group Topic/Focus:  Wrap-Up Group:   The focus of this group is to help patients review their daily goal of treatment and discuss progress on daily workbooks.  Participation Level:  Active  Participation Quality:  Appropriate and Attentive  Affect:  Appropriate  Cognitive:  Appropriate  Insight: Appropriate  Engagement in Group:  Engaged  Modes of Intervention:  Discussion and Support  Additional Comments:   Pt attended and actively engaged in the [place] group. Pt denied SI/HI/AVH. Pt rated his day 5/10. "I'm just trying to make it through the day sober". Pt reports making progress toward his goal by engaging in distraction activities such as reading, puzzles and talking to peers.  Wetzel Bjornstad Lailanie Hasley 09/18/2022, 9:00 PM

## 2022-09-18 NOTE — [facility] Group Notes (Signed)
Psychoeducational group- Patients were given two poems to read:  Jo Camaco titled '' The Owl and the Chimpanzee '' and '' There's a hole in my sidewalk '' by Portia Nelson.  The first poem discussed fight or flight response in comparison to the owl and the chimp. The patients were asked to identify times in their lives in which they behaved like the owl and the chimp and how they could identify triggers, and then healthy coping mechanisms. The patients were then asked to discuss the second poem and how it related to negative behavioral patterns and identifying ways for healthier coping.  Pt participated and was appropriate.  

## 2022-09-18 NOTE — Group Note (Signed)
LCSW Group Therapy Note  09/18/2022      Type of Therapy and Topic:  Group Therapy: Gratitude  Participation Level:  Did Not Attend   Description of Group:   In this group, patients shared and discussed the importance of acknowledging the elements in their lives for which they are grateful and how this can positively impact their mood.  The group discussed how bringing the positive elements of their lives to the forefront of their minds can help with recovery from any illness, physical or mental.  An exercise was done as a group in which a list was made of gratitude items in order to encourage participants to consider other potential positives in their lives.  Therapeutic Goals: Patients will identify one or more item for which they are grateful in each of 6 categories:  people, experiences, things, places, skills, and other. Patients will discuss how it is possible to seek out gratitude in even bad situations. Patients will explore other possible items of gratitude that they could remember.   Summary of Patient Progress:  Patient was invited to group, did not attend. n  Therapeutic Modalities:   Solution-Focused Therapy Activity  Francisco Hun Grossman-Orr, LCSW .

## 2022-09-18 NOTE — Plan of Care (Signed)
  Problem: Education: Goal: Knowledge of [place] [facility] Education information/materials will improve Outcome: Progressing Goal: Emotional status will improve Outcome: Progressing Goal: Mental status will improve Outcome: Progressing Goal: Verbalization of understanding the information provided will improve Outcome: Progressing   Problem: Activity: Goal: Interest or engagement in activities will improve Outcome: Progressing Goal: Sleeping patterns will improve Outcome: Progressing   Problem: Coping: Goal: Ability to verbalize frustrations and anger appropriately will improve Outcome: Progressing Goal: Ability to demonstrate self-control will improve Outcome: Progressing   

## 2022-09-18 NOTE — Progress Notes (Signed)
[facility] MD Progress Note  09/18/2022 11:53 AM Harveer Sadler  MRN:  [id number]  Reason For Admission: Kaymon Denomme is a 21 yo Caucasian male with prior mental health diagnoses of ADHD, MDD, GAD, polysubstance use  (ETOH, THC, amphetamines)  & social anxiety d/o who was IVC'd at [facility] for suicidal ideations with plan to overdose on Co2 or nitrous oxide. Pt was transported to the [facility] ER by [employer], and was subsequently transferred to this [facility] for treatment and stabilization of his mood.   Past 24 hours: Chart reviewed, pt's case discussed with his treatment team. V/S for the past 24 hrs have been WNL, and he is continuing to be compliant with his scheduled medications. Pt has not required any PRN medications in the past 24 hrs, and has not required any agitation protocol medications as well. Pt did not attend any group sessions yesterday, but attended group activity last night. He is cooperative with staff on the unit.  Today's patient assessment note: Pt currently denies SI, denies HI, denies AVH denies feelings of paranoia, and there is no evidence of delusional thinking.  He is reporting today that his mood has improved significantly since being admitted to this hospital.  He reports feeling ready for discharge.  Eye contact today is fair, speech is clear and coherent, thought contents are logical, and patient smiles intermittently throughout the assessment.  He is reporting a good appetite, and reported that his sleep quality last night was good.  He denies being in any physical distress, and states that his last bowel movement was yesterday.  He denies any medication related side effects.   Effexor increased to 150 mg starting today 09/18/2022. Pt is agreeable to this increase. Pt rates depression today as 5 (10 being the worst), and rates his anxiety as 5 (10 being worst). Current symptoms can be managed on an outpatient basis, and tentative plan is  to discharge pt tomorrow (10/23). Pt states that his parents will be able to come from [place], [place] to provide him with transportation back home. He reports that he wants to keep his outpatient mental health providers in [place]. We are continuing medications as listed below.  Principal Problem: MDD (major depressive disorder), severe (HCC) Diagnosis: Principal Problem:   MDD (major depressive disorder), severe (HCC) Active Problems:   Alcohol use disorder   Stimulant use disorder   Insomnia   Delta-9-tetrahydrocannabinol (THC) dependence (HCC)   Attention deficit hyperactivity disorder (ADHD)   GAD (generalized anxiety disorder)   Social anxiety disorder  Total Time spent with patient: 30 minutes  Past Psychiatric History: see above  Past Medical History: History reviewed. No pertinent past medical history. History reviewed. No pertinent surgical history. Family History: History reviewed. No pertinent family history. Family Psychiatric History: mother- MDD, (m) aunt- bipolar, substance abuse, completed suicide (2007)  Social History:  Social History   Substance and Sexual Activity  Alcohol Use Yes   Comment: reports previosly he would drink 8-10 standard drinks 3-4x weekly, since previous inpatient rehab sting in June, alcohol use has decreased to 6 stand drinks 1x weekly     Social History   Substance and Sexual Activity  Drug Use Yes   Types: Marijuana   Comment: reports reduciton in [facility] (Delta8) use from 3-4x weekly to 1x weekly since prior rehab stay in Jun of 2023    Social History   Socioeconomic History   Marital status: Single    Spouse name: Not on  file   Number of children: Not on file   Years of education: Not on file   Highest education level: Not on file  Occupational History   Not on file  Tobacco Use   Smoking status: Every Day    Packs/day: 0.25    Types: Cigarettes   Smokeless tobacco: Never  Substance and Sexual Activity   Alcohol use: Yes    Comment:  reports previosly he would drink 8-10 standard drinks 3-4x weekly, since previous inpatient rehab sting in June, alcohol use has decreased to 6 stand drinks 1x weekly   Drug use: Yes    Types: Marijuana    Comment: [facility] (Delta[facility]) use from 3-4x weekly to 1x weekly since prior rehab stay in Jun of 2023   Sexual activity: Not Currently    Partners: Female  Other Topics Concern   Not on file  Social History Narrative   Not on file   Social Determinants of Health   Financial Resource Strain: Not on file  Food Insecurity: Unknown (09/13/2022)   Hunger Vital Sign    Worried About Running Out of Food in the Last Year: Patient refused    Ran Out of Food in the Last Year: Patient refused  Transportation Needs: Unknown (09/13/2022)   PRAPARE - Administrator, Civil ServiceTransportation    Lack of Transportation (Medical): Patient refused    Lack of Transportation (Non-Medical): Patient refused  Physical Activity: Not on file  Stress: Not on file  Social Connections: Not on file   Additional Social History:    Sleep: Fair  Appetite:  Good  Current Medications: Current Facility-Administered Medications  Medication Dose Route Frequency Provider Last Rate Last Admin   acamprosate (CAMPRAL) tablet 666 mg  666 mg Oral TID WC Massengill, Harrold DonathNathan, MD   666 mg at 09/18/22 1143   acetaminophen (TYLENOL) tablet 650 mg  650 mg Oral Q6H PRN Leevy-Johnson, Brooke A, NP   650 mg at 09/15/22 0629   alum & mag hydroxide-simeth (MAALOX/MYLANTA) 200-200-20 MG/5ML suspension 30 mL  30 mL Oral Q4H PRN Leevy-Johnson, Brooke A, NP       buPROPion (WELLBUTRIN XL) 24 hr tablet 300 mg  300 mg Oral Daily Massengill, Nathan, MD   300 mg at 09/18/22 0803   busPIRone (BUSPAR) tablet 20 mg  20 mg Oral Q8H Massengill, Nathan, MD   20 mg at 09/18/22 16100643   hydrOXYzine (ATARAX) tablet 25 mg  25 mg Oral TID PRN Phineas InchesMassengill, Nathan, MD   25 mg at 09/16/22 2211   magnesium hydroxide (MILK OF MAGNESIA) suspension 30 mL  30 mL Oral  Daily PRN Leevy-Johnson, Brooke A, NP       mirtazapine (REMERON SOL-TAB) disintegrating tablet 30 mg  30 mg Oral QHS Massengill, Nathan, MD   30 mg at 09/17/22 2119   nicotine (NICODERM CQ - dosed in mg/24 hours) patch 21 mg  21 mg Transdermal Daily Waller Marcussen, NP   21 mg at 09/18/22 1143   polyvinyl alcohol (LIQUIFILM TEARS) 1.4 % ophthalmic solution 1 drop  1 drop Both Eyes PRN Leevy-Johnson, Brooke A, NP       propranolol (INDERAL) tablet 10 mg  10 mg Oral Q8H Massengill, Nathan, MD   10 mg at 09/17/22 2119   traZODone (DESYREL) tablet 50 mg  50 mg Oral QHS PRN Massengill, Harrold DonathNathan, MD   50 mg at 09/16/22 2211   venlafaxine XR (EFFEXOR-XR) 24 hr capsule 150 mg  150 mg Oral Q breakfast Starleen BlueNkwenti, Gearold Wainer, NP  150 mg at 09/18/22 1761   Lab Results:  No results found for this or any previous visit (from the past 48 hour(s)).  Blood Alcohol level:  Lab Results  Component Value Date   ETH <10 09/12/2022   Metabolic Disorder Labs: Lab Results  Component Value Date   HGBA1C 4.5 (L) 09/15/2022   MPG 82.45 09/15/2022   No results found for: "PROLACTIN" Lab Results  Component Value Date   CHOL 184 09/15/2022   TRIG 104 09/15/2022   HDL 50 09/15/2022   CHOLHDL 3.7 09/15/2022   VLDL 21 09/15/2022   LDLCALC 113 (H) 09/15/2022   Physical Findings: AIMS: Facial and Oral Movements Muscles of Facial Expression: None, normal Lips and Perioral Area: None, normal Jaw: None, normal Tongue: None, normal,Extremity Movements Upper (arms, wrists, hands, fingers): None, normal Lower (legs, knees, ankles, toes): None, normal, Trunk Movements Neck, shoulders, hips: None, normal, Overall Severity Severity of abnormal movements (highest score from questions above): None, normal Incapacitation due to abnormal movements: None, normal Patient's awareness of abnormal movements (rate only patient's report): No Awareness, Dental Status Current problems with teeth and/or dentures?: No Does patient usually  wear dentures?: No  CIWA:  n/a COWS: n/a AIMS: n/a   Musculoskeletal: Strength & Muscle Tone: within normal limits Gait & Station: normal Patient leans: N/A  Psychiatric Specialty Exam:  Presentation  General Appearance:  Appropriate for Environment; Fairly Groomed  Eye Contact: Fair  Speech: Clear and Coherent  Speech Volume: Normal  Handedness: Right  Mood and Affect  Mood: Depressed  Affect: Congruent  Thought Process  Thought Processes: Coherent  Descriptions of Associations:Intact  Orientation:Full (Time, Place and Person)  Thought Content:Logical  History of Schizophrenia/Schizoaffective disorder:No  Duration of Psychotic Symptoms:No data recorded Hallucinations:Hallucinations: None  Ideas of Reference:None  Suicidal Thoughts:Suicidal Thoughts: No  Homicidal Thoughts:Homicidal Thoughts: No  Sensorium  Memory: Immediate Good  Judgment: Good  Insight: Good  Executive Functions  Concentration: Good  Attention Span: Good  Recall: Good  Fund of Knowledge: Good  Language: Good  Psychomotor Activity  Psychomotor Activity: Psychomotor Activity: Normal  Assets  Assets: Communication Skills; Housing; Social Support  Sleep  Sleep: Sleep: Good  Physical Exam: Physical Exam Vitals and nursing note reviewed.  Constitutional:      Appearance: He is normal weight. He is not ill-appearing.  HENT:     Head: Normocephalic.     Nose: Nose normal.     Mouth/Throat:     Mouth: Mucous membranes are moist.     Pharynx: Oropharynx is clear.  Eyes:     Pupils: Pupils are equal, round, and reactive to light.  Cardiovascular:     Rate and Rhythm: Normal rate.     Pulses: Normal pulses.  Pulmonary:     Effort: Pulmonary effort is normal.  Abdominal:     Palpations: Abdomen is soft.  Musculoskeletal:        General: Normal range of motion.     Cervical back: Normal range of motion.  Skin:    General: Skin is dry.   Neurological:     Mental Status: He is alert.  Psychiatric:        Attention and Perception: He does not perceive auditory or visual hallucinations.        Mood and Affect: Mood is depressed.        Speech: Speech normal.        Behavior: Behavior is cooperative.        Thought Content: Thought content  is not paranoid or delusional. Thought content does not include homicidal or suicidal ideation. Thought content does not include homicidal or suicidal plan.        Cognition and Memory: Cognition and memory normal.    Review of Systems  Constitutional:  Negative for fever.  HENT:  Negative for hearing loss.   Cardiovascular: Negative.  Negative for chest pain.  Gastrointestinal:  Negative for heartburn.  Genitourinary: Negative.   Musculoskeletal:  Negative for myalgias.  Neurological:  Negative for dizziness.  Psychiatric/Behavioral:  Positive for depression (improving) and substance abuse. Negative for hallucinations, memory loss and suicidal ideas. The patient is nervous/anxious (improving) and has insomnia (resolving).   All other systems reviewed and are negative.  Blood pressure 106/63, pulse 80, temperature 97.6 F (36.4 C), temperature source Oral, resp. rate 16, height 6\' 2"  (1.88 m), weight 81.6 kg, SpO2 99 %. Body mass index is 23.11 kg/m.  Treatment Plan Summary: Daily contact with patient to assess and evaluate symptoms and progress in treatment, Medication management, and Plan :  PLAN Safety and Monitoring: Involuntary admission to inpatient psychiatric unit for safety, stabilization and treatment Daily contact with patient to assess and evaluate symptoms and progress in treatment Patient's case to be discussed in multi-disciplinary team meeting Observation Level : q15 minute checks Vital signs: q12 hours Precautions: Safety   Long Term Goal(s): Improvement in symptoms so as ready for discharge   Short Term Goals: Ability to identify changes in lifestyle to  reduce recurrence of condition will improve, Ability to disclose and discuss suicidal ideas, Ability to demonstrate self-control will improve, Ability to identify and develop effective coping behaviors will improve, Compliance with prescribed medications will improve, and Ability to identify triggers associated with substance abuse/mental health issues will improve   Principal Problem:   MDD (major depressive disorder), severe ([place]) Active Problems:   Alcohol use disorder   Stimulant use disorder   Insomnia   Delta-9-tetrahydrocannabinol (THC) dependence ([place])   Attention deficit hyperactivity disorder (ADHD)   GAD (generalized anxiety disorder)   Social anxiety disorder  Medications:  MDD (major depressive disorder), severe (HCC) -Continue Wellbutrin XL 300 mg daily -Previously discontinued Zoloft (home med)-Completely tapered off on 10/20 @ 0800 -Continue Venlafaxine XR 24 hr capsule to 150 mg daily  -Previously discontinued Quetiapine 100 mg due to drowsiness  Alcohol use disorder -Continue Campral 666 mg TID w/ meals  Insomnia -Continue Mirtazapine 30 mg daily HS -Continue Trazodone 50 mg nightly PRN insomnia  Attention deficit hyperactivity disorder (ADHD) -Continue Wellbutrin XL 300 mg daily   GAD (generalized anxiety disorder) Social anxiety disorder -Continue Buspirone 20 mg q8hrs -Continue Propanolol 20 mg q8hrs -Continue Hydroxyzine 25 mg TID PRN for anxiety  Nicotine Use  -Continue Nicotine patch to 21 mg daily; smoking cessation;  -Previously discontinue nicorette Gum 2 mg prn; smoking cessation (can only have one (gum or patch)-Pt chose patch on admission  -Continue Agitation protocol:       -(Zyprexa, Ativan, Geodon PRN)-See MAR   Other PRNS -Continue Tylenol 650 mg every 6 hours PRN for mild pain -Continue Maalox 30 mg every 4 hrs PRN for indigestion -Continue Milk of Magnesia as needed every 6 hrs for constipation   Discharge Planning: Social work and  case management to assist with discharge planning and identification of hospital follow-up needs prior to discharge Estimated LOS: 5-7 days Discharge Concerns: Need to establish a safety plan; Medication compliance and effectiveness Discharge Goals: Return home with outpatient referrals for mental health follow-up  including medication management/psychotherapy  Starleen Blue, NP 09/18/2022, 11:53 AMPatient ID: Jenny Reichmann, male   DOB: 2001-11-20, 21 y.o.   MRN: [id number] Patient ID: Decarlo Rivet, male   DOB: Aug 20, 2001,

## 2022-09-19 MED ORDER — POLYVINYL ALCOHOL 1.4 % OP SOLN: 1.0000 [drp] | OPHTHALMIC | 0 refills | Status: AC | PRN

## 2022-09-19 MED ORDER — BUPROPION HCL ER (XL) 300 MG PO TB24: 300.0000 mg | ORAL_TABLET | Freq: Every day | ORAL | 0 refills | Status: AC

## 2022-09-19 MED ORDER — VENLAFAXINE HCL ER 150 MG PO CP24: 150.0000 mg | ORAL_CAPSULE | Freq: Every day | ORAL | 0 refills | Status: AC

## 2022-09-19 MED ORDER — BUSPIRONE HCL 10 MG PO TABS: 20.0000 mg | ORAL_TABLET | Freq: Three times a day (TID) | ORAL | 0 refills | Status: AC

## 2022-09-19 MED ORDER — TRAZODONE HCL 50 MG PO TABS: 50.0000 mg | ORAL_TABLET | Freq: Every evening | ORAL | 0 refills | Status: AC | PRN

## 2022-09-19 MED ORDER — ACAMPROSATE CALCIUM 333 MG PO TBEC: 666.0000 mg | DELAYED_RELEASE_TABLET | Freq: Three times a day (TID) | ORAL | 0 refills | Status: AC

## 2022-09-19 MED ORDER — MIRTAZAPINE 30 MG PO TBDP: 30.0000 mg | ORAL_TABLET | Freq: Every day | ORAL | 0 refills | Status: AC

## 2022-09-19 MED ORDER — NICOTINE 21 MG/24HR TD PT24: 21.0000 mg | MEDICATED_PATCH | Freq: Every day | TRANSDERMAL | 0 refills | Status: AC

## 2022-09-19 MED ORDER — PROPRANOLOL HCL 10 MG PO TABS: 10.0000 mg | ORAL_TABLET | Freq: Three times a day (TID) | ORAL | 0 refills | Status: AC

## 2022-09-19 MED ORDER — HYDROXYZINE HCL 25 MG PO TABS: 25.0000 mg | ORAL_TABLET | Freq: Three times a day (TID) | ORAL | 0 refills | Status: AC | PRN

## 2022-09-19 NOTE — Discharge Summary (Signed)
Physician Discharge Summary Note  Patient:  Francisco Yates is an 21 y.o., male MRN:  [id number] DOB:  2000-12-16 Patient phone:  [phone] (home)  Patient address:   [address] [place] [id number],  Total Time spent with patient: 30 minutes  Date of Admission:  09/13/2022 Date of Discharge: 09/19/2022  Reason for Admission:  Francisco Yates is a 21 yo Caucasian male with prior mental health diagnoses of ADHD, MDD, GAD, polysubstance use  (ETOH, THC, amphetamines)  & social anxiety d/o who was IVC'd at [facility] for suicidal ideations with plan to overdose on Co2 or nitrous oxide. [facility] by [facility], and was subsequently [facility] for treatment and stabilization of his mood.   Principal Problem: MDD (major depressive disorder), severe (HCC) Discharge Diagnoses: Principal Problem:   MDD (major depressive disorder), severe (HCC) Active Problems:   Alcohol use disorder   Stimulant use disorder   Insomnia   Delta-9-tetrahydrocannabinol (THC) dependence (HCC)   Attention deficit hyperactivity disorder (ADHD)   GAD (generalized anxiety disorder)   Social anxiety disorder  Past Psychiatric History: As above  Past Medical History: History reviewed. No pertinent past medical history. History reviewed. No pertinent surgical history. Family History: History reviewed. No pertinent family history. Family Psychiatric  History: See H & P Social History:  Social History   Substance and Sexual Activity  Alcohol Use Yes   Comment: reports previosly he would drink 8-10 standard drinks 3-4x weekly, since previous inpatient rehab sting in June, alcohol use has decreased to 6 stand drinks 1x weekly     Social History   Substance and Sexual Activity  Drug Use Yes   Types: Marijuana   Comment: [facility] use from 3-4x weekly to 1x weekly since prior rehab stay in Jun of 2023     Social History   Socioeconomic History   Marital status: Single    Spouse name: Not on file   Number of children: Not on file   Years of education: Not on file   Highest education level: Not on file  Occupational History   Not on file  Tobacco Use   Smoking status: Every Day    Packs/day: 0.25    Types: Cigarettes   Smokeless tobacco: Never  Substance and Sexual Activity   Alcohol use: Yes    Comment: reports previosly he would drink 8-10 standard drinks 3-4x weekly, since previous inpatient rehab sting in June, alcohol use has decreased to 6 stand drinks 1x weekly   Drug use: Yes    Types: Marijuana    Comment: [facility] use from 3-4x weekly to 1x weekly since prior rehab stay in Jun of 2023   Sexual activity: Not Currently    Partners: Female  Other Topics Concern   Not on file  Social History Narrative   Not on file   Social Determinants of Health   Financial Resource Strain: Not on file  Food Insecurity: Unknown (09/13/2022)   Hunger Vital Sign    Worried About Running Out of Food in the Last Year: Patient refused    Ran Out of Food in the Last Year: Patient refused  Transportation Needs: Unknown (09/13/2022)   PRAPARE - Administrator, Civil ServiceTransportation    Lack of Transportation (Medical): Patient refused    Lack of Transportation (Non-Medical): Patient refused  Physical Activity: Not on file  Stress: Not on file  Social Connections:  Not on file                                      HOSPITAL COURSE During the patient's hospitalization, patient had extensive initial psychiatric evaluation, and follow-up psychiatric evaluations every day.  Psychiatric diagnoses provided upon initial assessment were: Principal Problem:   MDD (major depressive disorder), severe (HCC) Active Problems:   Alcohol use disorder   Stimulant use disorder   Insomnia   Delta-9-tetrahydrocannabinol (THC) dependence (HCC)   Attention deficit hyperactivity disorder (ADHD)   GAD  (generalized anxiety disorder)   Social anxiety disorder   Patient's psychiatric medications were adjusted on admission as follows: -Tapered sertraline from 200 mg to 150 mg once daily today, then decreased to 100 mg for 1 day, then to 50 mg for one day, then stopped.  -Started effexor 37.5 mg once daily (10/19), then increased to 75 mg once daily for MDD and anxiety. - Changed buspar from 30 mg bid to 20 mg bid for anxiety.  -Continued wellbutrin xl 300 mg once daily for ADHD & depression.  -Continued propranolol 20 mg q8H for anxiety.  -Decreased seroquel to 100 mg qhs for mood stabilization.  -Started mirtazapine 15 mg qhs for sleep, appetite stimulation and sleep.  -Started acamprosate 666 mg tid for alcohol use disorder (pt reports naltrexone previously made him more depressed)   During the hospitalization, other adjustments were made to the patient's psychiatric medication regimen, with medications at discharge being as follows: -Continue Campral 666 mg TID w/ meals for alcohol use d/o -Continue Mirtazapine 30 mg daily HS for sleep & depression -Continue Trazodone 50 mg nightly PRN insomnia  -Continue Wellbutrin XL 300 mg daily for ADHD       -Continue Buspirone 20 mg q8hrs for anxiety -Continue Propanolol 20 mg q8hrs for anxiety -Continue Hydroxyzine 25 mg TID PRN for anxiety  -Continue Nicotine patch to 21 mg daily; smoking cessation;   Patient's care was discussed during the interdisciplinary team meeting every day during the hospitalization. The patient denies having side effects to prescribed psychiatric medication. Gradually, patient started adjusting to milieu. The patient was evaluated each day by a clinical provider to ascertain response to treatment. Improvement was noted by the patient's report of decreasing symptoms, improved sleep and appetite, affect, medication tolerance, behavior, and participation in unit programming.  Patient was asked each day to complete a self  inventory noting mood, mental status, pain, new symptoms, anxiety and concerns.     Symptoms were reported as significantly decreased or resolved completely by discharge. On day of discharge, the patient reports that their mood is stable. The patient denied having suicidal thoughts for more than 48 hours prior to discharge.  Patient denies having homicidal thoughts.  Patient denies having auditory hallucinations.  Patient denies any visual hallucinations or other symptoms of psychosis. The patient was motivated to continue taking medication with a goal of continued improvement in mental health.    The patient reports their target psychiatric symptoms of depression, anxiety and insomnia responded well to the psychiatric medications, and the patient reports overall benefit from this psychiatric hospitalization. Supportive psychotherapy was provided to the patient. The patient also participated in regular group therapy while hospitalized. Coping skills, problem solving as well as relaxation therapies were also part of the unit programming.   Labs were reviewed with the patient, and abnormal results were discussed with the patient. LFTs were slightly  elevated on admission with AST-155 ad ALT-71. This was repeated and AST & ALT were trending down at 52 and 48 respectively. Pt has been educated on the need to follow this up with his Primary care provider.  Physical Findings: AIMS: Facial and Oral Movements Muscles of Facial Expression: None, normal Lips and Perioral Area: None, normal Jaw: None, normal Tongue: None, normal,Extremity Movements Upper (arms, wrists, hands, fingers): None, normal Lower (legs, knees, ankles, toes): None, normal, Trunk Movements Neck, shoulders, hips: None, normal, Overall Severity Severity of abnormal movements (highest score from questions above): None, normal Incapacitation due to abnormal movements: None, normal Patient's awareness of abnormal movements (rate only  patient's report): No Awareness, Dental Status Current problems with teeth and/or dentures?: No Does patient usually wear dentures?: No   CIWA:  n/a COWS:  n/a  AIMS: 0 Musculoskeletal: Strength & Muscle Tone: within normal limits Gait & Station: normal Patient leans: N/A  Psychiatric Specialty Exam:  Presentation  General Appearance:  Appropriate for Environment; Fairly Groomed  Eye Contact: Fair  Speech: Clear and Coherent  Speech Volume: Normal  Handedness: Right   Mood and Affect  Mood: Euthymic  Affect: Appropriate; Congruent   Thought Process  Thought Processes: Coherent  Descriptions of Associations:Intact  Orientation:Full (Time, Place and Person)  Thought Content:Logical  History of Schizophrenia/Schizoaffective disorder:No  Duration of Psychotic Symptoms:No data recorded Hallucinations:Hallucinations: None  Ideas of Reference:None  Suicidal Thoughts:Suicidal Thoughts: No  Homicidal Thoughts:Homicidal Thoughts: No   Sensorium  Memory: Immediate Good  Judgment: Good  Insight: Good   Executive Functions  Concentration: Good  Attention Span: Good  Recall: Good  Fund of Knowledge: Good  Language: Good   Psychomotor Activity  Psychomotor Activity: Psychomotor Activity: Normal   Assets  Assets: Communication Skills; Housing; Social Support   Sleep  Sleep: Sleep: Good    Physical Exam: Physical Exam Constitutional:      Appearance: Normal appearance.  HENT:     Head: Normocephalic.     Nose: Nose normal. No congestion or rhinorrhea.  Eyes:     Pupils: Pupils are equal, round, and reactive to light.  Pulmonary:     Effort: Pulmonary effort is normal.  Musculoskeletal:        General: Normal range of motion.     Cervical back: Normal range of motion.  Neurological:     General: No focal deficit present.     Mental Status: He is alert and oriented to person, place, and time.     Sensory: No  sensory deficit.     Coordination: Coordination normal.  Psychiatric:        Behavior: Behavior normal.        Thought Content: Thought content normal.    Review of Systems  Constitutional: Negative.  Negative for fever.  HENT: Negative.  Negative for hearing loss.   Respiratory: Negative.  Negative for cough.   Cardiovascular: Negative.  Negative for chest pain.  Gastrointestinal: Negative.   Genitourinary: Negative.   Musculoskeletal: Negative.   Skin: Negative.   Neurological: Negative.   Psychiatric/Behavioral:  Positive for depression (Reports that symptoms have significantly improved since admission to this [facility]) and substance abuse (Educated on cessation). Negative for hallucinations, memory loss and suicidal ideas. The patient is nervous/anxious (significantly improved) and has insomnia (significantly improved).    Blood pressure 117/72, pulse 86, temperature (!) 97.5 F (36.4 C), temperature source Oral, resp. rate 16, height 6\' 2"  (1.88 m), weight 81.6 kg, SpO2 98 %. Body mass index  is 23.11 kg/m.   Social History   Tobacco Use  Smoking Status Every Day   Packs/day: 0.25   Types: Cigarettes  Smokeless Tobacco Never   Tobacco Cessation:  A prescription for an FDA-approved tobacco cessation medication provided at discharge  Blood Alcohol level:  Lab Results  Component Value Date   ETH <10 09/12/2022   Metabolic Disorder Labs:  Lab Results  Component Value Date   HGBA1C 4.5 (L) 09/15/2022   MPG 82.45 09/15/2022   No results found for: "PROLACTIN" Lab Results  Component Value Date   CHOL 184 09/15/2022   TRIG 104 09/15/2022   HDL 50 09/15/2022   CHOLHDL 3.7 09/15/2022   VLDL 21 09/15/2022   LDLCALC 113 (H) 09/15/2022   See Psychiatric Specialty Exam and Suicide Risk Assessment completed by Attending Physician prior to discharge.  Discharge destination:  Home  Is patient on multiple antipsychotic therapies at discharge:  No   Has Patient had three  or more failed trials of antipsychotic monotherapy by history:  No  Recommended Plan for Multiple Antipsychotic Therapies: NA   Allergies as of 09/19/2022   No Known Allergies      Medication List     STOP taking these medications    Fish Oil 1000 MG Caps   QUEtiapine 300 MG 24 hr tablet Commonly known as: SEROQUEL XR   sertraline 100 MG tablet Commonly known as: ZOLOFT       TAKE these medications      Indication  acamprosate 333 MG tablet Commonly known as: CAMPRAL Take 2 tablets (666 mg total) by mouth 3 (three) times daily with meals.  Indication: Abuse or Misuse of Alcohol   buPROPion 300 MG 24 hr tablet Commonly known as: WELLBUTRIN XL Take 1 tablet (300 mg total) by mouth daily. Start taking on: September 20, 2022 What changed: when to take this  Indication: Attention Deficit Hyperactivity Disorder   busPIRone 10 MG tablet Commonly known as: BUSPAR Take 2 tablets (20 mg total) by mouth every 8 (eight) hours. What changed:  medication strength how much to take when to take this  Indication: Anxiety Disorder   hydrOXYzine 25 MG tablet Commonly known as: ATARAX Take 1 tablet (25 mg total) by mouth 3 (three) times daily as needed for anxiety.  Indication: Feeling Anxious   mirtazapine 30 MG disintegrating tablet Commonly known as: REMERON SOL-TAB Take 1 tablet (30 mg total) by mouth at bedtime.  Indication: Major Depressive Disorder   nicotine 21 mg/24hr patch Commonly known as: NICODERM CQ - dosed in mg/24 hours Place 1 patch (21 mg total) onto the skin daily. Start taking on: September 20, 2022  Indication: Nicotine Addiction   polyvinyl alcohol 1.4 % ophthalmic solution Commonly known as: LIQUIFILM TEARS Place 1 drop into both eyes as needed for dry eyes.  Indication: dry eyes   propranolol 10 MG tablet Commonly known as: INDERAL Take 1 tablet (10 mg total) by mouth every 8 (eight) hours. What changed:  medication strength how much to  take when to take this reasons to take this  Indication: Feeling Anxious   traZODone 50 MG tablet Commonly known as: DESYREL Take 1 tablet (50 mg total) by mouth at bedtime as needed for sleep.  Indication: Trouble Sleeping   venlafaxine XR 150 MG 24 hr capsule Commonly known as: EFFEXOR-XR Take 1 capsule (150 mg total) by mouth daily with breakfast. Start taking on: September 20, 2022  Indication: Major Depressive Disorder  Follow-up Information     [facility] Follow up on 09/21/2022.   Why: You are scheduled for a medication management appointment on 09/21/22 at 2:00pm.  This will be a virtual appointment. Contact information: [place] Office: [address] [place], [place] [address]  Phone: [phone] Fax: [phone]        Andrez Grime, PhD Follow up.   Why: We were unable to reach your provider during your hospitalization.  Please contact your therapist to schedule an appointment as soon as possible after discharge. Contact information: Address: [address] [place], [place] [address]  Phone: [phone]        [facility] Follow up.   Why: A referral has been made, please call to follow up about scheduling intake appointment. Contact information: [address] [place], [place], [place] [address] Phone: [phone] Fax: [phone]               Follow-up recommendations:    The patient is able to verbalize their individual safety plan to this provider.   # It is recommended to the patient to continue psychiatric medications as prescribed, after discharge from the hospital.     # It is recommended to the patient to follow up with your outpatient psychiatric provider and PCP.   # It was discussed with the patient, the impact of alcohol, drugs, tobacco have been there overall psychiatric and medical wellbeing, and total abstinence from substance use was recommended the patient.ed.   # Prescriptions provided or sent directly  to preferred pharmacy at discharge. Patient agreeable to plan. Given opportunity to ask questions. Appears to feel comfortable with discharge.    # In the event of worsening symptoms, the patient is instructed to call the crisis hotline, 911 and or go to the nearest ED for appropriate evaluation and treatment of symptoms. To follow-up with primary care provider for other medical issues, concerns and or health care needs   # Patient was discharged to his parents' home in [place], [place] with a plan to follow up as noted above.     Signed: Nicholes Rough, NP 09/19/2022, 11:40 AM

## 2022-09-19 NOTE — Progress Notes (Signed)
Pt discharged to lobby. Pt was stable and appreciative at that time. All papers, suicide safety plan and prescriptions were given and valuables returned. Verbal understanding expressed. Denies SI/HI and A/VH. Pt given opportunity to express concerns and ask questions.  

## 2022-09-19 NOTE — [facility] Suicide Risk Assessment (Signed)
Suicide Risk Assessment  Discharge Assessment    [facility] Discharge Suicide Risk Assessment   Principal Problem: MDD (major depressive disorder), severe ([place]) Discharge Diagnoses: Principal Problem:   MDD (major depressive disorder), severe ([place]) Active Problems:   Alcohol use disorder   Stimulant use disorder   Insomnia   Delta-9-tetrahydrocannabinol (THC) dependence (HCC)   Attention deficit hyperactivity disorder (ADHD)   GAD (generalized anxiety disorder)   Social anxiety disorder  Reason For Admission: Jefferson Fullam is a 21 yo Caucasian male with prior mental health diagnoses of ADHD, MDD, GAD, polysubstance use  (ETOH, THC, amphetamines)  & social anxiety d/o who was IVC'd at [facility] for suicidal ideations with plan to overdose on Co2 or nitrous oxide. Pt was transported to the [facility] ER by [employer], and was subsequently transferred to this [facility] for treatment and stabilization of his mood.                                           HOSPITAL COURSE During the patient's hospitalization, patient had extensive initial psychiatric evaluation, and follow-up psychiatric evaluations every day.  Psychiatric diagnoses provided upon initial assessment were: Principal Problem:   MDD (major depressive disorder), severe (HCC) Active Problems:   Alcohol use disorder   Stimulant use disorder   Insomnia   Delta-9-tetrahydrocannabinol (THC) dependence (HCC)   Attention deficit hyperactivity disorder (ADHD)   GAD (generalized anxiety disorder)   Social anxiety disorder  Patient's psychiatric medications were adjusted on admission as follows: -Tapered sertraline from 200 mg to 150 mg once daily today, then decreased to 100 mg for 1 day, then to 50 mg for one day, then stopped.  -Started effexor 37.5 mg once daily (10/19), then increased to 75 mg once daily for MDD and anxiety. - Changed buspar from 30 mg bid to 20 mg bid for anxiety.  -Continued  wellbutrin xl 300 mg once daily for ADHD & depression.  -Continued propranolol 20 mg q8H for anxiety.  -Decreased seroquel to 100 mg qhs for mood stabilization.  -Started mirtazapine 15 mg qhs for sleep, appetite stimulation and sleep.  -Started acamprosate 666 mg tid for alcohol use disorder (pt reports naltrexone previously made him more depressed)  During the hospitalization, other adjustments were made to the patient's psychiatric medication regimen, with medications at discharge being as follows: -Continue Campral 666 mg TID w/ meals for alcohol use d/o -Continue Mirtazapine 30 mg daily HS for sleep & depression -Continue Trazodone 50 mg nightly PRN insomnia  -Continue Wellbutrin XL 300 mg daily for ADHD       -Continue Buspirone 20 mg q8hrs for anxiety -Continue Propanolol 20 mg q8hrs for anxiety -Continue Hydroxyzine 25 mg TID PRN for anxiety  -Continue Nicotine patch to 21 mg daily; smoking cessation;  Patient's care was discussed during the interdisciplinary team meeting every day during the hospitalization. The patient denies having side effects to prescribed psychiatric medication. Gradually, patient started adjusting to milieu. The patient was evaluated each day by a clinical provider to ascertain response to treatment. Improvement was noted by the patient's report of decreasing symptoms, improved sleep and appetite, affect, medication tolerance, behavior, and participation in unit programming.  Patient was asked each day to complete a self inventory noting mood, mental status, pain, new symptoms, anxiety and concerns.    Symptoms were reported as significantly decreased or resolved completely  by discharge. On day of discharge, the patient reports that their mood is stable. The patient denied having suicidal thoughts for more than 48 hours prior to discharge.  Patient denies having homicidal thoughts.  Patient denies having auditory hallucinations.  Patient denies any visual  hallucinations or other symptoms of psychosis. The patient was motivated to continue taking medication with a goal of continued improvement in mental health.   The patient reports their target psychiatric symptoms of depression, anxiety and insomnia responded well to the psychiatric medications, and the patient reports overall benefit from this psychiatric hospitalization. Supportive psychotherapy was provided to the patient. The patient also participated in regular group therapy while hospitalized. Coping skills, problem solving as well as relaxation therapies were also part of the unit programming.  Labs were reviewed with the patient, and abnormal results were discussed with the patient. LFTs were slightly elevated on admission with AST-155 ad ALT-71. This was repeated and AST & ALT were trending down at 52 and 48 respectively. Pt has been educated on the need to follow this up with his Primary care provider.  Total Time spent with patient: 30 minutes  Musculoskeletal: Strength & Muscle Tone: within normal limits Gait & Station: normal Patient leans: N/A  Psychiatric Specialty Exam  Presentation  General Appearance:  Appropriate for Environment; Fairly Groomed  Eye Contact: Fair  Speech: Clear and Coherent  Speech Volume: Normal  Handedness: Right  Mood and Affect  Mood: Euthymic  Duration of Depression Symptoms: Greater than two weeks  Affect: Appropriate; Congruent  Thought Process  Thought Processes: Coherent  Descriptions of Associations:Intact  Orientation:Full (Time, Place and Person)  Thought Content:Logical  History of Schizophrenia/Schizoaffective disorder:No  Duration of Psychotic Symptoms:No data recorded Hallucinations:Hallucinations: None  Ideas of Reference:None  Suicidal Thoughts:Suicidal Thoughts: No  Homicidal Thoughts:Homicidal Thoughts: No  Sensorium  Memory: Immediate Good  Judgment: Good  Insight: Good  Executive Functions   Concentration: Good  Attention Span: Good  Recall: Good  Fund of Knowledge: Good  Language: Good  Psychomotor Activity  Psychomotor Activity: Psychomotor Activity: Normal  Assets  Assets: Communication Skills; Housing; Social Support  Sleep  Sleep: Sleep: Good  Physical Exam: Physical Exam Constitutional:      Appearance: Normal appearance.  HENT:     Head: Normocephalic.     Nose: Nose normal. No congestion or rhinorrhea.  Eyes:     Pupils: Pupils are equal, round, and reactive to light.  Pulmonary:     Effort: Pulmonary effort is normal. No respiratory distress.  Musculoskeletal:        General: Normal range of motion.     Cervical back: Normal range of motion. No rigidity.  Neurological:     Mental Status: He is alert and oriented to person, place, and time.     Sensory: No sensory deficit.     Coordination: Coordination normal.  Psychiatric:        Behavior: Behavior normal.        Thought Content: Thought content normal.    Review of Systems  Constitutional: Negative.  Negative for fever.  HENT: Negative.    Eyes:  Negative for blurred vision.  Respiratory: Negative.  Negative for cough.   Cardiovascular: Negative.   Gastrointestinal: Negative.   Genitourinary: Negative.   Musculoskeletal: Negative.   Skin: Negative.   Neurological: Negative.   Psychiatric/Behavioral:  Positive for depression (Reports that depressive symptoms have decreased significantly since admission, and mood has improved significantly. Denies SI/HI/AVH and verbally contracts for safety outside of  this hospital) and substance abuse (Educated on cessation). Negative for hallucinations, memory loss and suicidal ideas. The patient is nervous/anxious (resolving) and has insomnia (resolving).    Blood pressure 117/72, pulse 86, temperature (!) 97.5 F (36.4 C), temperature source Oral, resp. rate 16, height 6\' 2"  (1.88 m), weight 81.6 kg, SpO2 98 %. Body mass index is 23.11  kg/m.  Mental Status Per Nursing Assessment::   On Admission:  Self-harm thoughts  Demographic Factors:  Male, Caucasian, and Unemployed  Loss Factors: NA  Historical Factors: NA  Risk Reduction Factors:   Living with another person, especially a relative, Positive social support, and Positive therapeutic relationship  Continued Clinical Symptoms:  Patient reports that his depressive symptoms have improved significantly since admission. Pt denies SI, denies HI, denies AVH, denies paranoia and there is no evidence of delusional thinking. Pt is verbally contracting for safety outside of this [facility].  Cognitive Features That Contribute To Risk:  None    Suicide Risk:  Mild:  There are no identifiable suicide plans, no associated intent, mild dysphoria and related symptoms, good self-control (both objective and subjective assessment), few other risk factors, and identifiable protective factors, including available and accessible social support.    Follow-up Information     [facility] Follow up on 09/21/2022.   Why: You are scheduled for a medication management appointment on 09/21/22 at 2:00pm.  This will be a virtual appointment. Contact information: [place] Office: [address] [place], [place]  Phone: [phone] Fax: [phone]        [phone], PhD Follow up.   Why: We were unable to reach your provider during your hospitalization.  Please contact your therapist to schedule an appointment as soon as possible after discharge. Contact information: Address: [address] [place], [place]  Phone: [phone]        [facility] Follow up.   Why: A referral has been made, please call to follow up about scheduling intake appointment. Contact information: [address], [place], [place] Phone: [phone] Fax: [phone]               Plan Of Care/Follow-up recommendations:  The patient is able to  verbalize their individual safety plan to this provider.  # It is recommended to the patient to continue psychiatric medications as prescribed, after discharge from the hospital.    # It is recommended to the patient to follow up with your outpatient psychiatric provider and PCP.  # It was discussed with the patient, the impact of alcohol, drugs, tobacco have been there overall psychiatric and medical wellbeing, and total abstinence from substance use was recommended the patient.ed.  # Prescriptions provided or sent directly to preferred pharmacy at discharge. Patient agreeable to plan. Given opportunity to ask questions. Appears to feel comfortable with discharge.    # In the event of worsening symptoms, the patient is instructed to call the crisis hotline, 911 and or go to the nearest ED for appropriate evaluation and treatment of symptoms. To follow-up with primary care provider for other medical issues, concerns and or health care needs  # Patient was discharged to his parents' home in [place], [place] with a plan to follow up as noted above.   [place], NP 09/19/2022, 11:26 AM

## 2022-09-19 NOTE — [facility] Counselor (Signed)
CSW provided the Pt with a packet that contains information including shelter and housing resources, free and reduced price food information, clothing resources, crisis center information, a [employer] card, and suicide prevention information.   

## 2022-09-19 NOTE — Plan of Care (Signed)
Nurse discussed anxiety, depression and coping skills with patient.  

## 2022-09-19 NOTE — Progress Notes (Addendum)
  [facility] Adult Case Management Discharge Plan :  Will you be returning to the same living situation after discharge:  No. Patient to live with mother after discharge.  At discharge, do you have transportation home?: Yes,  Patient's father to provide transportation from hospital.  Do you have the ability to pay for your medications: Yes,  [employer] private insurance  Release of information consent forms completed and in the chart;  Patient's signature needed at discharge.  Patient to Follow up at:  Follow-up Information     [facility] Follow up on 09/21/2022.   Why: You are scheduled for a medication management appointment on 09/21/22 at 2:00pm.  This will be a virtual appointment. Contact information: [place] Office: [address] [place], [place] [address]  Phone: [phone] Fax: [phone]        Andrez Grime, PhD Follow up.   Why: We were unable to reach your provider during your hospitalization.  Please contact your therapist to schedule an appointment as soon as possible after discharge. Contact information: Address: [address] [place], [place] [address]  Phone: [phone]        [facility] Follow up.   Why: A referral has been made, please call to follow up about scheduling intake appointment. Contact information: [address] [place], [place],  [address] Phone: [phone] Fax: [phone]                Next level of care provider has access to [place] and Suicide Prevention discussed: Yes,  SPE completed with patient and Shahin Knierim [phone] (Mother)   Has patient been referred to the Quitline?: Yes, faxed on 09/19/2022 Tobacco Use: High Risk (09/14/2022)   Patient History    Smoking Tobacco Use: Every Day    Smokeless Tobacco Use: Never    Passive Exposure: Not on file   Patient has been referred for addiction treatment: Yes Patient referred to chemical dependency intensive  outpatient services. Facility information provided in discharge instructions (see above). Social History   Substance and Sexual Activity  Drug Use Yes   Types: Marijuana   Comment: reports reduciton in [facility] (Delta8) use from 3-4x weekly to 1x weekly since prior rehab stay in Jun of 2023   Social History   Substance and Sexual Activity  Alcohol Use Yes   Comment: reports previosly he would drink 8-10 standard drinks 3-4x weekly, since previous inpatient rehab sting in June, alcohol use has decreased to 6 stand drinks 1x weekly     Durenda Hurt, LCSWA 09/19/2022, 10:56 AM

## 2022-09-19 NOTE — Progress Notes (Signed)
D:  Patient's self inventory sheet, patient sleeps good, sleep medication helpful.  Fair appetite, low energy level, poor concentration.  Rated depression 4, hopeless 3, anxiety 4.  Denied withdrawals.  Denied SI.  Denied physical problems.  Denied physical pain.  Goal is set up top.  Plans to call IOP.  Does have discharge plans. A:  Medications administered per MD orders.  Emotional support and encouragement given patient. R:  Denied SI and HI, contracts for safety.  Denied A/V hallucinations.  Safety maintained with 15 minute checks.
